# Patient Record
Sex: Male | Born: 1995
Health system: Southern US, Community
[De-identification: ages and names within clinical notes are randomized; demographics above are authoritative.]

## PROBLEM LIST (undated history)

## (undated) DIAGNOSIS — F1010 Alcohol abuse, uncomplicated: Secondary | ICD-10-CM

## (undated) DIAGNOSIS — F329 Major depressive disorder, single episode, unspecified: Secondary | ICD-10-CM

## (undated) DIAGNOSIS — F411 Generalized anxiety disorder: Secondary | ICD-10-CM

## (undated) HISTORY — DX: Major depressive disorder, single episode, unspecified: F32.9

## (undated) HISTORY — DX: Alcohol abuse, uncomplicated: F10.10

## (undated) HISTORY — DX: Generalized anxiety disorder: F41.1

---
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
## 2015-02-17 ENCOUNTER — Emergency Department (INDEPENDENT_AMBULATORY_CARE_PROVIDER_SITE_OTHER)
Admission: EM | Admit: 2015-02-17 | Discharge: 2015-02-17 | Disposition: A | Discharge status: Home / Self Care | Payer: 59 | Source: Home / Self Care | Attending: Family Medicine | Admitting: Family Medicine

## 2015-02-17 DIAGNOSIS — J029 Acute pharyngitis, unspecified: Secondary | ICD-10-CM | POA: Diagnosis not present

## 2015-02-17 DIAGNOSIS — J039 Acute tonsillitis, unspecified: Secondary | ICD-10-CM

## 2015-02-17 LAB — POCT RAPID STREP A (OFFICE): Rapid Strep A Screen: NEGATIVE

## 2015-02-17 MED ORDER — LIDOCAINE VISCOUS 2 % MT SOLN: 15.0000 mL | Freq: Three times a day (TID) | OROMUCOSAL | Status: DC

## 2015-02-17 MED ORDER — AZITHROMYCIN 250 MG PO TABS: ORAL_TABLET | ORAL | Status: DC

## 2015-02-17 MED ORDER — ACETAMINOPHEN-CODEINE 120-12 MG/5ML PO SOLN: ORAL | Status: DC

## 2015-02-17 NOTE — ED Provider Notes (Signed)
CSN: [id number]     Arrival date & time 02/17/15  1348 History   First MD Initiated Contact with Patient 02/17/15 1552     Chief Complaint  Patient presents with  . Sore Throat      HPI Comments: Patient complains of two day history of severe sore throat with fatigue, chills/sweats, and headache.  He states that a family member has had mono.  The history is provided by the patient and a parent.    History reviewed. No pertinent past medical history. History reviewed. No pertinent past surgical history. No family history on file. History  Substance Use Topics  . Smoking status: Never Smoker   . Smokeless tobacco: Not on file  . Alcohol Use: No    Review of Systems + sore throat No cough No pleuritic pain No wheezing No nasal congestion ? post-nasal drainage No sinus pain/pressure No itchy/red eyes No earache No hemoptysis No SOB No fever, + chills/sweats No nausea No vomiting No abdominal pain No diarrhea No urinary symptoms No skin rash + fatigue No myalgias + headache Used OTC meds without relief  Allergies  Review of patient's allergies indicates no known allergies.  Home Medications   Prior to Admission medications   Medication Sig Start Date End Date Taking? Authorizing Provider  acetaminophen-codeine 120-12 MG/5ML solution Take 5 to 10mL by mouth every 4 to 6 hours as needed for pain. 02/17/15   Lattie HawStephen A Aidon Klemens, MD  azithromycin (ZITHROMAX Z-PAK) 250 MG tablet Take 2 tabs today; then begin one tab once daily for 4 more days. 02/17/15   Lattie HawStephen A Ellery Meroney, MD  lidocaine (XYLOCAINE) 2 % solution Use as directed 15 mLs in the mouth or throat 4 (four) times daily -  before meals and at bedtime. Gargle, then expectorate 02/17/15   Lattie HawStephen A Aniela Caniglia, MD   BP 124/76 mmHg  Pulse 88  Temp(Src) 98.3 F (36.8 C) (Oral)  Wt 150 lb (68.04 kg)  SpO2 98% Physical Exam Nursing notes and Vital Signs reviewed. Appearance:  Patient appears stated age, and in no acute  distress Eyes:  Pupils are equal, round, and reactive to light and accomodation.  Extraocular movement is intact.  Conjunctivae are not inflamed  Ears:  Canals normal.  Tympanic membranes normal.  Nose:   Normal turbinates.  No sinus tenderness.  Pharynx:  Large erythematous tonsils with exudate.  No obstruction Neck:  Supple.  Large tender anterior nodes are palpated bilaterally  Lungs:  Clear to auscultation.  Breath sounds are equal.  Heart:  Regular rate and rhythm without murmurs, rubs, or gallops.  Abdomen:  Nontender without masses or hepatosplenomegaly.  Bowel sounds are present.  No CVA or flank tenderness.  Extremities:  No edema.  No calf tenderness Skin:  No rash present.   ED Course  Procedures  None   Labs Reviewed  STREP A DNA PROBE negative  EPSTEIN-BARR VIRUS VCA, IGG          MDM   1. Acute tonsillitis; ?false negative strep vs ?mono    Throat culture pending.  Mother declines mono antibody testing. Begin empiric Z-pack.  Lidocaine gargles. Try warm salt water gargles for sore throat.  May take Ibuprofen 200mg , 3 or 4 tabs every 8 hours with food. If symptoms become significantly worse during the night or over the weekend, proceed to the local emergency room.  Followup with Family Doctor if not improved in one week.     Lattie HawStephen A Panda Crossin, MD 02/24/15 [phone]

## 2015-02-17 NOTE — ED Notes (Signed)
Patient complains of sore throat and fever. Symptoms started yesterday.

## 2015-02-17 NOTE — Discharge Instructions (Signed)
Try warm salt water gargles for sore throat.  May take Ibuprofen 200mg , 3 or 4 tabs every 8 hours with food. If symptoms become significantly worse during the night or over the weekend, proceed to the local emergency room.

## 2015-02-18 LAB — STREP A DNA PROBE: GASP: POSITIVE

## 2015-02-19 ENCOUNTER — Telehealth: Payer: Self-pay | Admitting: *Deleted

## 2015-02-19 MED ORDER — PREDNISONE 10 MG (21) PO TBPK: ORAL_TABLET | ORAL | Status: DC

## 2015-11-20 ENCOUNTER — Emergency Department (INDEPENDENT_AMBULATORY_CARE_PROVIDER_SITE_OTHER)
Admission: EM | Admit: 2015-11-20 | Discharge: 2015-11-20 | Disposition: A | Discharge status: Home / Self Care | Payer: PRIVATE HEALTH INSURANCE | Source: Home / Self Care | Attending: Family Medicine | Admitting: Family Medicine

## 2015-11-20 ENCOUNTER — Encounter: Payer: Self-pay | Admitting: *Deleted

## 2015-11-20 DIAGNOSIS — J101 Influenza due to other identified influenza virus with other respiratory manifestations: Secondary | ICD-10-CM | POA: Diagnosis not present

## 2015-11-20 DIAGNOSIS — J9801 Acute bronchospasm: Secondary | ICD-10-CM | POA: Diagnosis not present

## 2015-11-20 LAB — POCT INFLUENZA A/B
INFLUENZA B, POC: NEGATIVE
Influenza A, POC: POSITIVE — AB

## 2015-11-20 MED ORDER — ALBUTEROL SULFATE HFA 108 (90 BASE) MCG/ACT IN AERS: 2.0000 | INHALATION_SPRAY | RESPIRATORY_TRACT | Status: DC | PRN

## 2015-11-20 MED ORDER — PREDNISONE 20 MG PO TABS: 20.0000 mg | ORAL_TABLET | Freq: Two times a day (BID) | ORAL | Status: DC

## 2015-11-20 MED ORDER — OSELTAMIVIR PHOSPHATE 75 MG PO CAPS: 75.0000 mg | ORAL_CAPSULE | Freq: Two times a day (BID) | ORAL | Status: DC

## 2015-11-20 MED FILL — OSELTAMIVIR PHOS 75 MG CAP: 75 | 5 days supply | Qty: 10 | Fill #0

## 2015-11-20 MED FILL — predniSONE 20 MG TABS: 20 | 5 days supply | Qty: 10 | Fill #0

## 2015-11-20 NOTE — ED Notes (Signed)
Pt c/o fever, aches, chills, congestion and cough x yesterday. His family member tested positive for flu A a couple of days ago. Taken 1 dose of Tamiflu, 2 puffs albuterol and generic otc cold/flu medication. Family member requests flu test performed.

## 2015-11-20 NOTE — Discharge Instructions (Signed)
Take plain guaifenesin (  extended release tabs such as Mucinex) twice daily, with plenty of water, for cough and congestion.  May add Pseudoephedrine ( , one or two every 4 to 6 hours) for sinus congestion.  Get adequate rest.   May use Afrin nasal spray (or generic oxymetazoline) twice daily for about 5 days and then discontinue.  Also recommend using saline nasal spray several times daily and saline nasal irrigation (AYR is a common brand).  Try warm salt water gargles for sore throat.  Stop all antihistamines for now, and other non-prescription cough/cold preparations. May take Delsym Cough Suppressant at bedtime for nighttime cough.  Follow-up with family doctor if not improving about one week or if fever persists. Recommend a flu shot when well.    Asthma, Adult Asthma is a recurring condition in which the airways tighten and narrow. Asthma can make it difficult to breathe. It can cause coughing, wheezing, and shortness of breath. Asthma episodes, also called asthma attacks, range from minor to life-threatening. Asthma cannot be cured, but medicines and lifestyle changes can help control it. CAUSES Asthma is believed to be caused by inherited (genetic) and environmental factors, but its exact cause is unknown. Asthma may be triggered by allergens, lung infections, or irritants in the air. Asthma triggers are different for each person. Common triggers include:   Animal dander.  Dust mites.  Cockroaches.  Pollen from trees or grass.  Mold.  Smoke.  Air pollutants such as dust, household cleaners, hair sprays, aerosol sprays, paint fumes, strong chemicals, or strong odors.  Cold air, weather changes, and winds (which increase molds and pollens in the air).  Strong emotional expressions such as crying or laughing hard.  Stress.  Certain medicines (such as aspirin) or types of drugs (such as beta-blockers).  Sulfites in foods and drinks. Foods and drinks that may contain  sulfites include dried fruit, potato chips, and sparkling grape juice.  Infections or inflammatory conditions such as the flu, a cold, or an inflammation of the nasal membranes (rhinitis).  Gastroesophageal reflux disease (GERD).  Exercise or strenuous activity. SYMPTOMS Symptoms may occur immediately after asthma is triggered or many hours later. Symptoms include:  Wheezing.  Excessive nighttime or early morning coughing.  Frequent or severe coughing with a common cold.  Chest tightness.  Shortness of breath. DIAGNOSIS  The diagnosis of asthma is made by a review of your medical history and a physical exam. Tests may also be performed. These may include:  Lung function studies. These tests show how much air you breathe in and out.  Allergy tests.  Imaging tests such as X-rays. TREATMENT  Asthma cannot be cured, but it can usually be controlled. Treatment involves identifying and avoiding your asthma triggers. It also involves medicines. There are 2 classes of medicine used for asthma treatment:   Controller medicines. These prevent asthma symptoms from occurring. They are usually taken every day.  Reliever or rescue medicines. These quickly relieve asthma symptoms. They are used as needed and provide short-term relief. Your health care provider will help you create an asthma action plan. An asthma action plan is a written plan for managing and treating your asthma attacks. It includes a list of your asthma triggers and how they may be avoided. It also includes information on when medicines should be taken and when their dosage should be changed. An action plan may also involve the use of a device called a peak flow meter. A peak flow meter measures how well the  lungs are working. It helps you monitor your condition. HOME CARE INSTRUCTIONS   Take medicines only as directed by your health care provider. Speak with your health care provider if you have questions about how or when to  take the medicines.  Use a peak flow meter as directed by your health care provider. Record and keep track of readings.  Understand and use the action plan to help minimize or stop an asthma attack without needing to seek medical care.  Control your home environment in the following ways to help prevent asthma attacks:  Do not smoke. Avoid being exposed to secondhand smoke.  Change your heating and air conditioning filter regularly.  Limit your use of fireplaces and wood stoves.  Get rid of pests (such as roaches and mice) and their droppings.  Throw away plants if you see mold on them.  Clean your floors and dust regularly. Use unscented cleaning products.  Try to have someone else vacuum for you regularly. Stay out of rooms while they are being vacuumed and for a short while afterward. If you vacuum, use a dust mask from a hardware store, a double-layered or microfilter vacuum cleaner bag, or a vacuum cleaner with a HEPA filter.  Replace carpet with wood, tile, or vinyl flooring. Carpet can trap dander and dust.  Use allergy-proof pillows, mattress covers, and box spring covers.  Wash bed sheets and blankets every week in hot water and dry them in a dryer.  Use blankets that are made of polyester or cotton.  Clean bathrooms and kitchens with bleach. If possible, have someone repaint the walls in these rooms with mold-resistant paint. Keep out of the rooms that are being cleaned and painted.  Wash hands frequently. SEEK MEDICAL CARE IF:   You have wheezing, shortness of breath, or a cough even if taking medicine to prevent attacks.  The colored mucus you cough up (sputum) is thicker than usual.  Your sputum changes from clear or white to yellow, green, gray, or bloody.  You have any problems that may be related to the medicines you are taking (such as a rash, itching, swelling, or trouble breathing).  You are using a reliever medicine more than 2-3 times per week.  Your  peak flow is still at 50-79% of your personal best after following your action plan for 1 hour.  You have a fever. SEEK IMMEDIATE MEDICAL CARE IF:   You seem to be getting worse and are unresponsive to treatment during an asthma attack.  You are short of breath even at rest.  You get short of breath when doing very little physical activity.  You have difficulty eating, drinking, or talking due to asthma symptoms.  You develop chest pain.  You develop a fast heartbeat.  You have a bluish color to your lips or fingernails.  You are light-headed, dizzy, or faint.  Your peak flow is less than 50% of your personal best.   This information is not intended to replace advice given to you by your health care provider. Make sure you discuss any questions you have with your health care provider.   Document Released: 09/28/2005 Document Revised: 06/19/2015 Document Reviewed: 04/27/2013 Elsevier Interactive Patient Education [employer]

## 2015-11-20 NOTE — ED Provider Notes (Signed)
CSN: [id number]     Arrival date & time 11/20/15  0830 History   None    Chief Complaint  Patient presents with  . Generalized Body Aches  . Fever      HPI Comments: Complains of 1 day history flu-like illness including myalgias, headache, fever/chills, fatigue, and cough.  Also has mild nasal congestion, but no sore throat.  Cough is non-productive and somewhat worse at night.  No pleuritic pain or shortness of breath.  He has not had a flu shot this season.  He has mild allergy induced asthma and has had increased wheezing today and yesterday, improved with an albuterol inhaler.  A family member tested positive for influenza A two days ago.  The history is provided by the patient and a parent.    History reviewed. No pertinent past medical history. History reviewed. No pertinent past surgical history. History reviewed. No pertinent family history. Social History  Substance Use Topics  . Smoking status: Current Every Day Smoker  . Smokeless tobacco: None  . Alcohol Use: No    Review of Systems No sore throat + cough No pleuritic pain + wheezing + nasal congestion + post-nasal drainage No sinus pain/pressure No itchy/red eyes No earache No hemoptysis No SOB + fever, + chills No nausea No vomiting No abdominal pain No diarrhea, but episode loose stools No urinary symptoms No skin rash + fatigue + myalgias + headache Used OTC meds without relief  Allergies  Review of patient's allergies indicates no known allergies.  Home Medications   Prior to Admission medications   Medication Sig Start Date End Date Taking? Authorizing Provider  albuterol (PROVENTIL HFA;VENTOLIN HFA) 108 (90 Base) MCG/ACT inhaler Inhale 2 puffs into the lungs every 4 (four) hours as needed for wheezing or shortness of breath. 11/20/15   Lattie Haw, MD  oseltamivir (TAMIFLU) 75 MG capsule Take 1 capsule (75 mg total) by mouth every 12 (twelve) hours. 11/20/15   Lattie Haw, MD  predniSONE  (DELTASONE) 20 MG tablet Take 1 tablet (20 mg total) by mouth 2 (two) times daily. Take with food. 11/20/15   Lattie Haw, MD   Meds Ordered and Administered this Visit  Medications - No data to display  BP 119/71 mmHg  Pulse 87  Temp(Src) 99.5 F (37.5 C) (Oral)  Resp 16  Wt 139 lb (63.05 kg)  SpO2 96% No data found.   Physical Exam Nursing notes and Vital Signs reviewed. Appearance:  Patient appears stated age, and in no acute distress Eyes:  Pupils are equal, round, and reactive to light and accomodation.  Extraocular movement is intact.  Conjunctivae are not inflamed  Ears:  Canals normal.  Tympanic membranes normal.  Nose:  Congested turbinates.  No sinus tenderness.  [name] Kitchen  Pharynx:  Normal Neck:  Supple.  Nontender enlarged posterior nodes are palpated bilaterally  Lungs:  Faint bibasilar wheezes present. Breath sounds are equal.  Moving air well. Heart:  Regular rate and rhythm without murmurs, rubs, or gallops.  Abdomen:  Nontender without masses or hepatosplenomegaly.  Bowel sounds are present.  No CVA or flank tenderness.  Extremities:  No edema.  Skin:  No rash present.   ED Course  Procedures nontender    Labs Reviewed  POCT INFLUENZA A/B - Abnormal; Notable for the following:    Influenza A, POC Positive (*)    All other components within normal limits     MDM   1. Influenza A   2.  Bronchospasm    Begin Tamiflu, and prednisone burst.  Rx for albuterol MDI Take plain guaifenesin (  extended release tabs such as Mucinex) twice daily, with plenty of water, for cough and congestion.  May add Pseudoephedrine ( , one or two every 4 to 6 hours) for sinus congestion.  Get adequate rest.   May use Afrin nasal spray (or generic oxymetazoline) twice daily for about 5 days and then discontinue.  Also recommend using saline nasal spray several times daily and saline nasal irrigation (AYR is a common brand).  Try warm salt water gargles for sore throat.  Stop all  antihistamines for now, and other non-prescription cough/cold preparations. May take Delsym Cough Suppressant at bedtime for nighttime cough.  Follow-up with family doctor if not improving about one week or if fever persists. Recommend a flu shot when well.     Lattie Haw, MD 11/20/15 1002

## 2017-09-13 ENCOUNTER — Ambulatory Visit (INDEPENDENT_AMBULATORY_CARE_PROVIDER_SITE_OTHER): Payer: 59 | Admitting: Family Medicine

## 2017-09-13 ENCOUNTER — Encounter: Payer: Self-pay | Admitting: Family Medicine

## 2017-09-13 VITALS — BP 124/70 | HR 64 | Ht 73.0 in | Wt 163.0 lb

## 2017-09-13 DIAGNOSIS — F411 Generalized anxiety disorder: Secondary | ICD-10-CM

## 2017-09-13 DIAGNOSIS — F321 Major depressive disorder, single episode, moderate: Secondary | ICD-10-CM | POA: Diagnosis not present

## 2017-09-13 DIAGNOSIS — F329 Major depressive disorder, single episode, unspecified: Secondary | ICD-10-CM | POA: Insufficient documentation

## 2017-09-13 DIAGNOSIS — R5383 Other fatigue: Secondary | ICD-10-CM

## 2017-09-13 HISTORY — DX: Generalized anxiety disorder: F41.1

## 2017-09-13 HISTORY — DX: Major depressive disorder, single episode, unspecified: F32.9

## 2017-09-13 MED ORDER — SERTRALINE HCL 50 MG PO TABS: ORAL_TABLET | ORAL | 3 refills | Status: DC

## 2017-09-13 NOTE — Patient Instructions (Addendum)
Thank you for coming in today. Take zoloft daily until you come back.  Take 1/2 pill daily and then take a whole pill.  Recheck in 2 weeks.  We will consider blood work then.    Generalized Anxiety Disorder, Adult Generalized anxiety disorder (GAD) is a mental health disorder. People with this condition constantly worry about everyday events. Unlike normal anxiety, worry related to GAD is not triggered by a specific event. These worries also do not fade or get better with time. GAD interferes with life functions, including relationships, work, and school. GAD can vary from mild to severe. People with severe GAD can have intense waves of anxiety with physical symptoms (panic attacks). What are the causes? The exact cause of GAD is not known. What increases the risk? This condition is more likely to develop in:  Women.  People who have a family history of anxiety disorders.  People who are very shy.  People who experience very stressful life events, such as the death of a loved one.  People who have a very stressful family environment.  What are the signs or symptoms? People with GAD often worry excessively about many things in their lives, such as their health and family. They may also be overly concerned about:  Doing well at work.  Being on time.  Natural disasters.  Friendships.  Physical symptoms of GAD include:  Fatigue.  Muscle tension or having muscle twitches.  Trembling or feeling shaky.  Being easily startled.  Feeling like your heart is pounding or racing.  Feeling out of breath or like you cannot take a deep breath.  Having trouble falling asleep or staying asleep.  Sweating.  Nausea, diarrhea, or irritable bowel syndrome (IBS).  Headaches.  Trouble concentrating or remembering facts.  Restlessness.  Irritability.  How is this diagnosed? Your health care provider can diagnose GAD based on your symptoms and medical history. You will also have  a physical exam. The health care provider will ask specific questions about your symptoms, including how severe they are, when they started, and if they come and go. Your health care provider may ask you about your use of alcohol or drugs, including prescription medicines. Your health care provider may refer you to a mental health specialist for further evaluation. Your health care provider will do a thorough examination and may perform additional tests to rule out other possible causes of your symptoms. To be diagnosed with GAD, a person must have anxiety that:  Is out of his or her control.  Affects several different aspects of his or her life, such as work and relationships.  Causes distress that makes him or her unable to take part in normal activities.  Includes at least three physical symptoms of GAD, such as restlessness, fatigue, trouble concentrating, irritability, muscle tension, or sleep problems.  Before your health care provider can confirm a diagnosis of GAD, these symptoms must be present more days than they are not, and they must last for six months or longer. How is this treated? The following therapies are usually used to treat GAD:  Medicine. Antidepressant medicine is usually prescribed for long-term daily control. Antianxiety medicines may be added in severe cases, especially when panic attacks occur.  Talk therapy (psychotherapy). Certain types of talk therapy can be helpful in treating GAD by providing support, education, and guidance. Options include: ? Cognitive behavioral therapy (CBT). People learn coping skills and techniques to ease their anxiety. They learn to identify unrealistic or negative thoughts and  behaviors and to replace them with positive ones. ? Acceptance and commitment therapy (ACT). This treatment teaches people how to be mindful as a way to cope with unwanted thoughts and feelings. ? Biofeedback. This process trains you to manage your body's response  (physiological response) through breathing techniques and relaxation methods. You will work with a therapist while machines are used to monitor your physical symptoms.  Stress management techniques. These include yoga, meditation, and exercise.  A mental health specialist can help determine which treatment is best for you. Some people see improvement with one type of therapy. However, other people require a combination of therapies. Follow these instructions at home:  Take over-the-counter and prescription medicines only as told by your health care provider.  Try to maintain a normal routine.  Try to anticipate stressful situations and allow extra time to manage them.  Practice any stress management or self-calming techniques as taught by your health care provider.  Do not punish yourself for setbacks or for not making progress.  Try to recognize your accomplishments, even if they are small.  Keep all follow-up visits as told by your health care provider. This is important. Contact a health care provider if:  Your symptoms do not get better.  Your symptoms get worse.  You have signs of depression, such as: ? A persistently sad, cranky, or irritable mood. ? Loss of enjoyment in activities that used to bring you joy. ? Change in weight or eating. ? Changes in sleeping habits. ? Avoiding friends or family members. ? Loss of energy for normal tasks. ? Feelings of guilt or worthlessness. Get help right away if:  You have serious thoughts about hurting yourself or others. If you ever feel like you may hurt yourself or others, or have thoughts about taking your own life, get help right away. You can go to your nearest emergency department or call:  Your local emergency services (911 in the [place]  A suicide crisis helpline, such as the National Suicide Prevention Lifeline at 91064571341-904 572 7720. This is open 24 hours a day.  Summary  Generalized anxiety disorder (GAD) is a mental  health disorder that involves worry that is not triggered by a specific event.  People with GAD often worry excessively about many things in their lives, such as their health and family.  GAD may cause physical symptoms such as restlessness, trouble concentrating, sleep problems, frequent sweating, nausea, diarrhea, headaches, and trembling or muscle twitching.  A mental health specialist can help determine which treatment is best for you. Some people see improvement with one type of therapy. However, other people require a combination of therapies. This information is not intended to replace advice given to you by your health care provider. Make sure you discuss any questions you have with your health care provider. Document Released: 01/23/2013 Document Revised: 08/18/2016 Document Reviewed: 08/18/2016 Elsevier Interactive Patient Education  2018 [employer]    Major Depressive Disorder, Adult Major depressive disorder (MDD) is a mental health condition. MDD often makes you feel sad, hopeless, or helpless. MDD can also cause symptoms in your body. MDD can affect your:  Work.  School.  Relationships.  Other normal activities.  MDD can range from mild to very bad. It may occur once (single episode MDD). It can also occur many times (recurrent MDD). The main symptoms of MDD often include:  Feeling sad, depressed, or irritable most of the time.  Loss of interest.  MDD symptoms also include:  Sleeping too much or too  little.  Eating too much or too little.  A change in your weight.  Feeling tired (fatigue) or having low energy.  Feeling worthless.  Feeling guilty.  Trouble making decisions.  Trouble thinking clearly.  Thoughts of suicide or harming others.  Feeling weak.  Feeling agitated.  Keeping yourself from being around other people (isolation).  Follow these instructions at home: Activity  Do these things as told by your doctor: ? Go back to your  normal activities. ? Exercise regularly. ? Spend time outdoors. Alcohol  Talk with your doctor about how alcohol can affect your antidepressant medicines.  Do not drink alcohol. Or, limit how much alcohol you drink. ? This means no more than 1 drink a day for nonpregnant women and 2 drinks a day for men. One drink equals one of these:  12 oz of beer.  5 oz of wine.  1 oz of hard liquor. General instructions  Take over-the-counter and prescription medicines only as told by your doctor.  Eat a healthy diet.  Get plenty of sleep.  Find activities that you enjoy. Make time to do them.  Think about joining a support group. Your doctor may be able to suggest a group for you.  Keep all follow-up visits as told by your doctor. This is important. Where to find more information:  The [employer] on Mental Illness: ? www.nami.org  [place] [employer] of Mental Health: ? http://www.maynard.net/  National Suicide Prevention Lifeline: ? 907 174 9420. This is free, 24-hour help. Contact a doctor if:  Your symptoms get worse.  You have new symptoms. Get help right away if:  You self-harm.  You see, hear, taste, smell, or feel things that are not present (hallucinate). If you ever feel like you may hurt yourself or others, or have thoughts about taking your own life, get help right away. You can go to your nearest emergency department or call:  Your local emergency services (911 in the [place]  A suicide crisis helpline, such as the National Suicide Prevention Lifeline: ? (818)658-7787. This is open 24 hours a day.  This information is not intended to replace advice given to you by your health care provider. Make sure you discuss any questions you have with your health care provider. Document Released: 09/09/2015 Document Revised: 06/14/2016 Document Reviewed: 06/14/2016 Elsevier Interactive Patient Education  2017 [employer].

## 2017-09-13 NOTE — Progress Notes (Signed)
Sheral FlowShawn M Kozakiewicz is a 21 y.o. male who [facility] today for anxiety.  Patient endorsing 2-3 weeks or worsening anxiety and depression. Patient states that he has struggled with underlying anxiety/depression for the past couple years, but it has been especially bad in the last 2-3 weeks. States that he feels lonely often and unhappy.  In terms of depressive symptoms, patient endorses symptoms 3-4 days per week. Feels poor mood and moves slowly. Poor sleep and feels fatigued generally. Also with poor appetite, eating approximately 1 meal per day. Feels that he is performing at work adequately, but does not feel engaged while he is there. Feels internally slow, but wouldn't necessarily expect others to notice.  He does endorse anxiety symptoms as well, occurring 2-3 times per week. Feels his mind racing when this does happen. States that he focuses on everything that is wrong when he feels anxious. Not keeping him awake at night.  Endorses occasional marijuana use. No other illicit substance use. Patient has not self-harmed. He did have passive SI 3 days ago. No active plan. Was counseled on seeking care with trusted individual, physician, ED, or 911 if he has active SI.    History reviewed. No pertinent past medical history. History reviewed. No pertinent surgical history. Social History   Tobacco Use  . Smoking status: Current Every Day Smoker  . Smokeless tobacco: Never Used  Substance Use Topics  . Alcohol use: No   family history includes Depression in his brother.  ROS as above: No headache, visual changes, nausea, vomiting, diarrhea, constipation, dizziness, abdominal pain, skin rash, fevers, chills, night sweats, weight loss, swollen lymph nodes, body aches, joint swelling, muscle aches, chest pain, shortness of breath, mood changes, visual or auditory  hallucinations.   Medications: Current Outpatient Medications  Medication Sig Dispense Refill  . albuterol (PROVENTIL HFA;VENTOLIN HFA) 108 (90 Base) MCG/ACT inhaler Inhale 2 puffs into the lungs every 4 (four) hours as needed for wheezing or shortness of breath. (Patient not taking: Reported on 09/13/2017) 1 Inhaler 0  . sertraline (ZOLOFT) 50 MG tablet Take 0.5 tablets (25 mg total) by mouth daily for 7 days, THEN 1 tablet (50 mg total) daily for 21 days. 30 tablet 3   No current facility-administered medications for this visit.    No Known Allergies  Health Maintenance Health Maintenance  Topic Date Due  . HIV Screening  09/18/2011  . TETANUS/TDAP  09/18/2015  . INFLUENZA VACCINE  05/16/2018 (Originally 05/12/2017)     Exam:  BP 124/70   Pulse 64   Ht 6\' 1"  (1.854 m)   Wt 163 lb (73.9 kg)   BMI 21.51 kg/m  Gen: Well NAD HEENT: EOMI,  MMM Lungs: Normal work of breathing. CTABL Heart: RRR no MRG Abd: NABS, Soft. Nondistended, Nontender Exts: Brisk capillary refill, warm and well perfused. Psych: Depressed mood. Affect congruent with stated mood. No psychomotor retardation, but decreased range of emotion on interview. Feels tired. No evidence of psychotic features, mania, or substance use.    Depression screen PHQ 2/9 09/13/2017  Decreased Interest 1  Down, Depressed, Hopeless 2  PHQ - 2 Score 3  Altered sleeping 2  Tired, decreased energy 3  Change in appetite 3  Feeling bad or failure about yourself  3  Trouble concentrating 2  Moving slowly or fidgety/restless 2  Suicidal thoughts 1  PHQ-9 Score 19   GAD 7 : Generalized Anxiety Score 09/13/2017  Nervous, Anxious, on Edge 2  Control/stop worrying 3  Worry too much - different things 2  Trouble relaxing 1  Restless 1  Easily annoyed or irritable 3  Afraid - awful might happen 2  Total GAD 7 Score 14  Anxiety Difficulty Very difficult       No results found for this or any previous visit (from the past 72  hour(s)). No results found.    Assessment and Plan: 21 y.o. male with major depression and anxiety symptoms. No evidence of mania, psychotic features, or substance abuse.  Patient will benefit from Zoloft 25 mg, titrated up as tolerated. He will follow-up with us in 2 weeks. Also referred to behavioral health for CBT.  Patient counseled on safety plan if having suicidal thoughts. Should seek attention from health care professional, ED, 911, or at minimum trusted friend/family. He will return to follow-up in 2 weeks.   Orders Placed This Encounter  Procedures  . Ambulatory referral to Behavioral Health    Referral Priority:   Routine    Referral Type:   Psychiatric    Referral Reason:   Specialty Services Required    Requested Specialty:   Behavioral Health    Number of Visits Requested:   1   Meds ordered this encounter  Medications  . sertraline (ZOLOFT) 50 MG tablet    Sig: Take 0.5 tablets (25 mg total) by mouth daily for 7 days, THEN 1 tablet (50 mg total) daily for 21 days.    Dispense:  30 tablet    Refill:  3     Discussed warning signs or symptoms. Please see discharge instructions. Patient expresses understanding.

## 2017-09-21 ENCOUNTER — Ambulatory Visit (INDEPENDENT_AMBULATORY_CARE_PROVIDER_SITE_OTHER): Payer: 59 | Admitting: Family Medicine

## 2017-09-21 ENCOUNTER — Ambulatory Visit (INDEPENDENT_AMBULATORY_CARE_PROVIDER_SITE_OTHER): Payer: 59

## 2017-09-21 ENCOUNTER — Encounter: Payer: Self-pay | Admitting: Family Medicine

## 2017-09-21 VITALS — BP 136/78 | HR 72

## 2017-09-21 DIAGNOSIS — M79675 Pain in left toe(s): Secondary | ICD-10-CM | POA: Diagnosis not present

## 2017-09-21 DIAGNOSIS — W2209XA Striking against other stationary object, initial encounter: Secondary | ICD-10-CM

## 2017-09-21 DIAGNOSIS — F411 Generalized anxiety disorder: Secondary | ICD-10-CM | POA: Diagnosis not present

## 2017-09-21 DIAGNOSIS — F321 Major depressive disorder, single episode, moderate: Secondary | ICD-10-CM | POA: Diagnosis not present

## 2017-09-21 DIAGNOSIS — S92535A Nondisplaced fracture of distal phalanx of left lesser toe(s), initial encounter for closed fracture: Secondary | ICD-10-CM

## 2017-09-21 DIAGNOSIS — S62667A Nondisplaced fracture of distal phalanx of left little finger, initial encounter for closed fracture: Secondary | ICD-10-CM | POA: Diagnosis not present

## 2017-09-21 NOTE — Progress Notes (Signed)
       Albert Hobbs is a 21 y.o. male who [facility] today for left toe injury.   Albert Hobbs stubbed his left 5th toe yesterday. He developed pain immediately and note that it is bruised and painful today. He denies any bleeding. No fever, chills, NVD.   Additionally he notes that he has started the Zoloft that was prescribed on the 3rd for anxiety and depression. He has increased from 25 mg to 50mg . He is tolerating it well and is feeling a bit better.    No past medical history on file. No past surgical history on file. Social History   Tobacco Use  . Smoking status: Current Every Day Smoker  . Smokeless tobacco: Never Used  Substance Use Topics  . Alcohol use: No   family history includes Depression in his brother.  ROS as above:  Medications: Current Outpatient Medications  Medication Sig Dispense Refill  . albuterol (PROVENTIL HFA;VENTOLIN HFA) 108 (90 Base) MCG/ACT inhaler Inhale 2 puffs into the lungs every 4 (four) hours as needed for wheezing or shortness of breath. 1 Inhaler 0  . sertraline (ZOLOFT) 50 MG tablet Take 0.5 tablets (25 mg total) by mouth daily for 7 days, THEN 1 tablet (50 mg total) daily for 21 days. 30 tablet 3   No current facility-administered medications for this visit.    No Known Allergies  Health Maintenance Health Maintenance  Topic Date Due  . HIV Screening  09/18/2011  . TETANUS/TDAP  09/18/2015  . INFLUENZA VACCINE  05/16/2018 (Originally 05/12/2017)     Exam:  BP 136/78   Pulse 72  Gen: Well NAD  Left foot:  Ecchymosis and tenderness at the left fifth toe.  Otherwise normal-appearing and nontender pulses capillary refill and sensation are intact. Psych: Alert and oriented. Normal affect and speech. No SI or HI.      No results found for this or any previous visit (from the past 72 hour(s)). Dg Toe 5th  Left  Result Date: 09/21/2017 CLINICAL DATA:  Pain, injury, swelling EXAM: DG TOE 5TH LEFT COMPARISON:  06/02/2008 FINDINGS: Left fifth toe soft tissue swelling noted. Nondisplaced fracture suspected of the left fifth digit distal phalanx, best demonstrated on the lateral view. No associated subluxation or dislocation. No joint abnormality. IMPRESSION: Nondisplaced left fifth digit distal phalanx fracture with soft tissue swelling. Electronically Signed   By: Judie PetitM.  Shick M.D.   On: 09/21/2017 11:03      Assessment and Plan: 21 y.o. male with left 5th toe fracture.  Plan for buddy tape and post op shoe PRN.  Recheck as scheduled on Dec 31st.  Anxiety and depression: Doing better. Continue zoloft titration and follow up on Dec 31st.    Orders Placed This Encounter  Procedures  . DG Toe 5th Left    Order Specific Question:   Reason for exam:    Answer:   left 5th toe fx?    Order Specific Question:   Preferred imaging location?    [facility]   No orders of the defined types were placed in this encounter.    Discussed warning signs or symptoms. Please see discharge instructions. Patient expresses understanding.

## 2017-09-21 NOTE — Patient Instructions (Signed)
Thank you for coming in today. Use the post op shoe as needed.  Follow up as scheduled.  I will let you know what the radiologists say about the xray.    How to Buddy Tape Buddy taping refers to taping an injured finger or toe to an uninjured finger or toe that is next to it. This protects the injured finger or toe and keeps it from moving while the injury heals. You may buddy tape a finger or toe if you have a minor sprain. Your health care provider may buddy tape your finger or toe if you have a sprain, dislocation, or fracture. You may be told to replace your buddy taping as needed. What are the risks? Generally, buddy taping is safe. However, problems may occur, such as:  Skin injury or infection.  Reduced blood flow to the finger or toe.  Skin reaction to the tape.  Do not buddy tape your toe if you have diabetes. Do not buddy tape if you know that you have an allergy to adhesives or surgical tape. How to buddy tape Before Buddy Taping Try to reduce any pain and swelling with rest, icing, and elevation:  Avoid any activity that causes pain.  Raise (elevate) your hand or foot above the level of your heart while you are sitting or lying down.  If directed, apply ice to the injured area: ? Put ice in a plastic bag. ? Place a towel between your skin and the bag. ? Leave the ice on for 20 minutes, 2-3 times per day.  Buddy Taping Procedure  Clean and dry your finger or toe as told by your health care provider.  Place a gauze pad or a piece of cloth or cotton between your injured finger or toe and the uninjured finger or toe.  Use tape to wrap around both fingers or toes so your injured finger or toe is secured to the uninjured finger or toe. ? The tape should be snug, but not tight. ? Make sure the ends of the piece of tape overlap. ? Avoid placing tape directly over the joint.  Change the tape and the padding as told by your health care provider. Remove and replace the  tape or padding if it becomes loose, worn, dirty, or wet. After Buddy Taping  Take over-the-counter and prescription medicines only as told by your health care provider.  Return to your normal activities as told by your health care provider. Ask your health care provider what activities are safe for you.  Watch the buddy-taped area and always remove buddy taping if: ? Your pain gets worse. ? Your fingers turn pale or blue. ? Your skin becomes irritated. Contact a health care provider if:  You have pain, swelling, or bruising that lasts longer than three days.  You have a fever.  Your skin is red, cracked, or irritated. Get help right away if:  The injured area becomes cold, numb, or pale.  You have severe pain, swelling, bruising, or loss of movement in your finger or toe.  Your finger or toe changes shape (deformity). This information is not intended to replace advice given to you by your health care provider. Make sure you discuss any questions you have with your health care provider. Document Released: 11/05/2004 Document Revised: 03/05/2016 Document Reviewed: 02/20/2015 Elsevier Interactive Patient Education  [employer]2018 Else[employer] Inc.

## 2017-10-11 ENCOUNTER — Ambulatory Visit: Payer: Self-pay | Admitting: Family Medicine

## 2017-10-28 ENCOUNTER — Ambulatory Visit (HOSPITAL_COMMUNITY): Payer: BLUE CROSS/BLUE SHIELD | Admitting: Licensed Clinical Social Worker

## 2017-11-28 DIAGNOSIS — S6991XA Unspecified injury of right wrist, hand and finger(s), initial encounter: Secondary | ICD-10-CM | POA: Diagnosis not present

## 2017-11-28 DIAGNOSIS — F17210 Nicotine dependence, cigarettes, uncomplicated: Secondary | ICD-10-CM | POA: Diagnosis not present

## 2017-11-28 DIAGNOSIS — F1010 Alcohol abuse, uncomplicated: Secondary | ICD-10-CM | POA: Diagnosis not present

## 2017-11-28 DIAGNOSIS — S60221A Contusion of right hand, initial encounter: Secondary | ICD-10-CM | POA: Diagnosis not present

## 2017-11-28 DIAGNOSIS — F1994 Other psychoactive substance use, unspecified with psychoactive substance-induced mood disorder: Secondary | ICD-10-CM | POA: Diagnosis not present

## 2017-11-29 DIAGNOSIS — S6991XA Unspecified injury of right wrist, hand and finger(s), initial encounter: Secondary | ICD-10-CM | POA: Diagnosis not present

## 2018-02-10 ENCOUNTER — Ambulatory Visit: Payer: Self-pay | Admitting: Physician Assistant

## 2018-04-15 ENCOUNTER — Ambulatory Visit (INDEPENDENT_AMBULATORY_CARE_PROVIDER_SITE_OTHER): Payer: No Typology Code available for payment source | Admitting: Physician Assistant

## 2018-04-15 ENCOUNTER — Encounter: Payer: Self-pay | Admitting: Physician Assistant

## 2018-04-15 VITALS — BP 126/76 | HR 81 | Ht 72.99 in | Wt 157.0 lb

## 2018-04-15 DIAGNOSIS — F321 Major depressive disorder, single episode, moderate: Secondary | ICD-10-CM | POA: Diagnosis not present

## 2018-04-15 DIAGNOSIS — R45851 Suicidal ideations: Secondary | ICD-10-CM | POA: Diagnosis not present

## 2018-04-15 DIAGNOSIS — F411 Generalized anxiety disorder: Secondary | ICD-10-CM | POA: Diagnosis not present

## 2018-04-15 DIAGNOSIS — F1010 Alcohol abuse, uncomplicated: Secondary | ICD-10-CM | POA: Diagnosis not present

## 2018-04-15 HISTORY — DX: Alcohol abuse, uncomplicated: F10.10

## 2018-04-15 MED ORDER — HYDROXYZINE PAMOATE 25 MG PO CAPS: 25.0000 mg | ORAL_CAPSULE | Freq: Three times a day (TID) | ORAL | 1 refills | Status: DC | PRN

## 2018-04-15 NOTE — Patient Instructions (Signed)
Suicidal Feelings: How to Help Yourself  Suicide is the taking of one's own life. If you feel as though life is getting too tough to handle and are thinking about suicide, get help right away. To get help:  Call your local emergency services (911 in the [place]  Call a suicide hotline to speak with a trained counselor who understands how you are feeling. The following is a list of suicide hotlines in the [place]. For a list of hotlines in [place], visit www.suicide.org/hotlines/international/[place]-suicide-hotlines.html.  1-800-273-TALK (1-800-273-8255).  1-800-SUICIDE (1-800-784-2433).  1-888-628-9454. This is a hotline for Spanish speakers.  1-800-799-4TTY (1-800-799-4889). This is a hotline for TTY users.  1-866-4-U-TREVOR (1-866-488-7386). This is a hotline for lesbian, gay, bisexual, transgender, or questioning youth.  Contact a crisis center or a local suicide prevention center. To find a crisis center or suicide prevention center:  Call your local hospital, clinic, community service organization, mental health center, social service provider, or health department. Ask for assistance in connecting to a crisis center.  Visit www.suicidepreventionlifeline.org/getinvolved/locator for a list of crisis centers in the [place], or visit www.suicideprevention.ca/thinking-about-suicide/find-a-crisis-centre for a list of centers in [place].  Visit the following websites:  National Suicide Prevention Lifeline: www.suicidepreventionlifeline.org  Hopeline: www.hopeline.com  American Foundation for Suicide Prevention: www.afsp.org  The Trevor Project (for lesbian, gay, bisexual, transgender, or questioning youth): www.thetrevorproject.org    How can I help myself feel better?  Promise yourself that you will not do anything drastic when you have suicidal feelings. Remember, there is hope. Many people have gotten through suicidal thoughts and feelings, and you will, too. You may have gotten through them before, and  this proves that you can get through them again.  Let family, friends, teachers, or counselors know how you are feeling. Try not to isolate yourself from those who care about you. Remember, they will want to help you. Talk with someone every day, even if you do not feel sociable. Face-to-face conversation is best.  Call a mental health professional and see one regularly.  Visit your primary health care provider every year.  Eat a well-balanced diet, and space your meals so you eat regularly.  Get plenty of rest.  Avoid alcohol and drugs, and remove them from your home. They will only make you feel worse.  If you are thinking of taking a lot of medicine, give your medicine to someone who can give it to you one day at a time. If you are on antidepressants and are concerned you will overdose, let your health care provider know so he or she can give you safer medicines. Ask your mental health professional about the possible side effects of any medicines you are taking.  Remove weapons, poisons, knives, and anything else that could harm you from your home.  Try to stick to routines. Follow a schedule every day. Put self-care on your schedule.  Make a list of realistic goals, and cross them off when you achieve them. Accomplishments give a sense of worth.  Wait until you are feeling better before doing the things you find difficult or unpleasant.  Exercise if you are able. You will feel better if you exercise for even a half hour each day.  Go out in the sun or into nature. This will help you recover from depression faster. If you have a favorite place to walk, go there.  Do the things that have always given you pleasure. Play your favorite music, read a good book, paint a picture, play your favorite   instrument, or do anything else that takes your mind off your depression if it is safe to do.  Keep your living space well lit.  When you are feeling well, write yourself a letter about tips and support that you can read when  you are not feeling well.  Remember that life's difficulties can be sorted out with help. Conditions can be treated. You can work on thoughts and strategies that serve you well.  This information is not intended to replace advice given to you by your health care provider. Make sure you discuss any questions you have with your health care provider.  Document Released: 04/04/2003 Document Revised: 05/27/2016 Document Reviewed: 01/23/2014  Elsevier Interactive Patient Education  2018 [employer] Inc[employer]

## 2018-04-15 NOTE — Progress Notes (Signed)
Subjective:    Patient ID: Albert Hobbs, male    DOB: 1996-07-21, 22 y.o.   MRN: [id number]  HPI  Pt is a 22 yo male with a long hx of anxiety and depression with alcohol abuse who presents to the clinic with suidical thoughts. Pt has had anxiety and depression since a very young age 52-22 years old. He has never been treated with medications. He tried zoloft once but did not like the way he felt on it. He is drinking every day at least 10 beers. Denies any self harm. Denies any illegal drug use. No marijuanna use. No problems sleeping. Has had suicidal thoughts off and on for many years. Recently feels like they are worsening. He states "he just feels like the world would be better if he were dead". He told his mother yesterday after girlfriend kicked him out of their apartment. Today he denies any plan but he does state the plan of "hanging himself". His mother has him a plane ticket tomorrow for a rehab in [place].  .. Active Ambulatory Problems    Diagnosis Date Noted  . MDD (major depressive disorder) 09/13/2017  . GAD (generalized anxiety disorder) 09/13/2017  . Alcohol abuse 04/15/2018  . Suicidal thoughts 04/15/2018   Resolved Ambulatory Problems    Diagnosis Date Noted  . No Resolved Ambulatory Problems   No Additional Past Medical History    Review of Systems  All other systems reviewed and are negative.      Objective:   Physical Exam  Constitutional: He is oriented to person, place, and time. He appears well-developed and well-nourished.  HENT:  Head: Normocephalic and atraumatic.  Cardiovascular: Normal rate and regular rhythm.  Pulmonary/Chest: Effort normal and breath sounds normal.  Neurological: He is alert and oriented to person, place, and time.  Psychiatric:  Flat affect/avoided eye contact          Assessment & Plan:  [name] KitchenMarland KitchenEsai was seen today for depression.  Diagnoses and all orders for this visit:  Current moderate episode of major depressive  disorder without prior episode (HCC)  GAD (generalized anxiety disorder) -     hydrOXYzine (VISTARIL) 25 MG capsule; Take 1 capsule (25 mg total) by mouth 3 (three) times daily as needed.  Alcohol abuse -     hydrOXYzine (VISTARIL) 25 MG capsule; Take 1 capsule (25 mg total) by mouth 3 (three) times daily as needed.  Suicidal thoughts   .[name] Kitchen Depression screen [facility] 2/9 04/15/2018 09/13/2017  Decreased Interest 2 1  Down, Depressed, Hopeless 3 2  PHQ - 2 Score 5 3  Altered sleeping 2 2  Tired, decreased energy 2 3  Change in appetite 3 3  Feeling bad or failure about yourself  3 3  Trouble concentrating 1 2  Moving slowly or fidgety/restless 1 2  Suicidal thoughts 2 1  PHQ-9 Score 19 19  Difficult doing work/chores Very difficult -   .. GAD 7 : Generalized Anxiety Score 04/15/2018 09/13/2017  Nervous, Anxious, on Edge 2 2  Control/stop worrying 3 3  Worry too much - different things 2 2  Trouble relaxing 2 1  Restless 2 1  Easily annoyed or irritable 3 3  Afraid - awful might happen 2 2  Total GAD 7 Score 16 14  Anxiety Difficulty Very difficult Very difficult   Pt did not want to start daily medication. I am cautious to start a dependent medication such as benzodiazapine's due to abuse potential. Lets start vistaril as  needed. Pt is going this weekend to [place] [facility] for alcohol detox. Discussed when he gets back counseling referral. Potentially he needs to consider trying other medications. Gave him HO of resources and places to go if were to continue to have suicidal thoughts. He states he is not actively going to hurt himself. Follow up after return from [place] with PCP.     [name] Kitchen..Spent 30 minutes with patient and greater than 50 percent of visit spent counseling patient regarding treatment plan.

## 2018-05-14 IMAGING — DX DG TOE 5TH 2+V*L*
3 series · 3 of 3 positions shown · non-contrast
Comparison: 06/02/2008

CLINICAL DATA: Pain, injury, swelling

EXAM:
DG TOE 5TH LEFT

[toe ap]
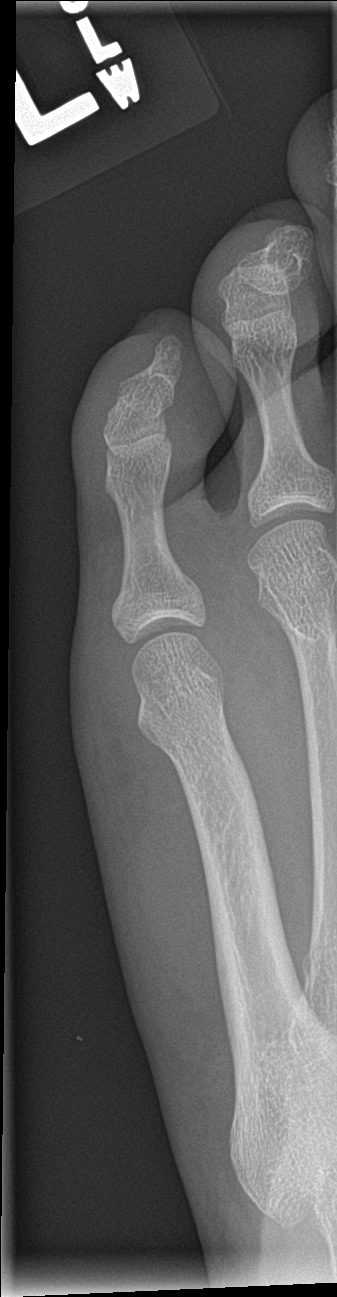

[toe obl]
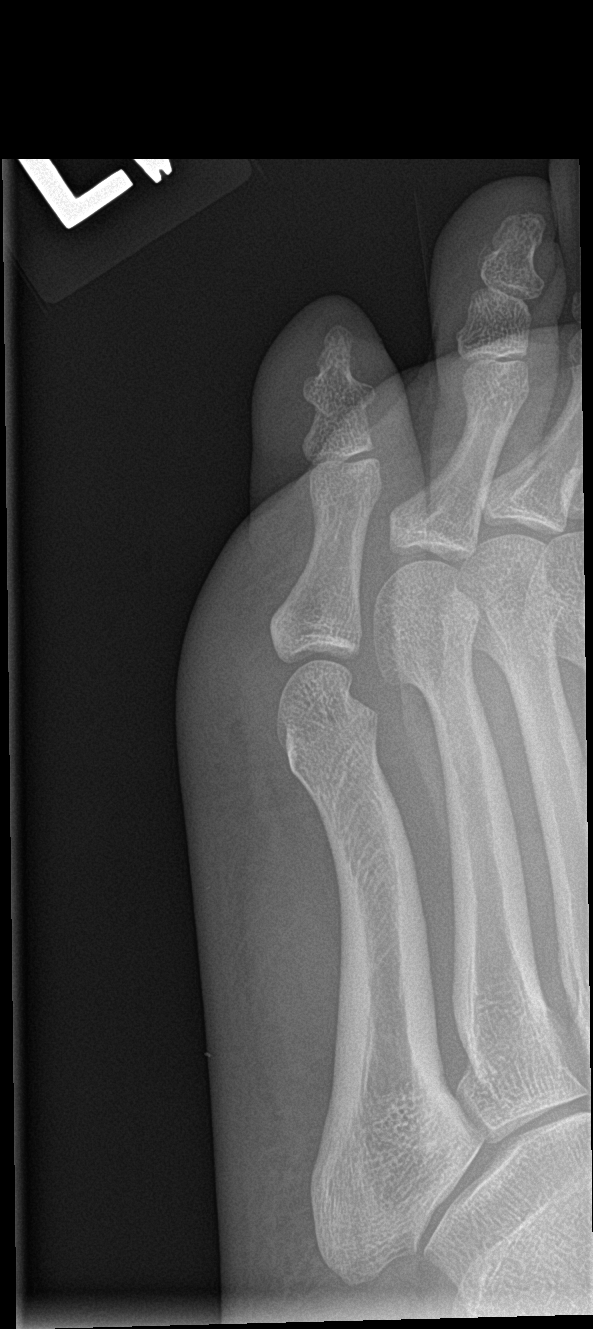

[toe lat]
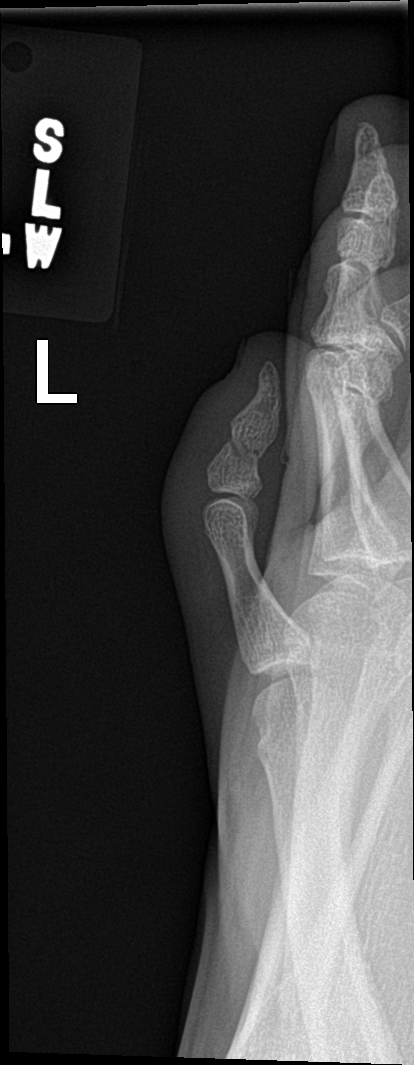

[3 of 3 positions shown; findings below may reference images not displayed]

FINDINGS: Left fifth toe soft tissue swelling noted. Nondisplaced fracture
suspected of the left fifth digit distal phalanx, best demonstrated
on the lateral view. No associated subluxation or dislocation. No
joint abnormality.
IMPRESSION: Nondisplaced left fifth digit distal phalanx fracture with soft
tissue swelling.

## 2018-06-09 ENCOUNTER — Telehealth: Payer: Self-pay | Admitting: Family Medicine

## 2018-06-09 NOTE — Telephone Encounter (Signed)
Called patient twice but got a recording that patient has a vm that has not been setup. Albert Hobbs,CMA

## 2018-06-09 NOTE — Telephone Encounter (Signed)
Recommend follow-up with me to discuss his depression.

## 2019-02-17 ENCOUNTER — Encounter: Payer: Self-pay | Admitting: Family Medicine

## 2019-02-17 ENCOUNTER — Ambulatory Visit (INDEPENDENT_AMBULATORY_CARE_PROVIDER_SITE_OTHER): Payer: BLUE CROSS/BLUE SHIELD | Admitting: Family Medicine

## 2019-02-17 VITALS — Ht 73.0 in | Wt 180.0 lb

## 2019-02-17 DIAGNOSIS — F321 Major depressive disorder, single episode, moderate: Secondary | ICD-10-CM

## 2019-02-17 DIAGNOSIS — F411 Generalized anxiety disorder: Secondary | ICD-10-CM

## 2019-02-17 DIAGNOSIS — F1010 Alcohol abuse, uncomplicated: Secondary | ICD-10-CM | POA: Diagnosis not present

## 2019-02-17 MED ORDER — BUSPIRONE HCL 10 MG PO TABS: 10.0000 mg | ORAL_TABLET | Freq: Three times a day (TID) | ORAL | 1 refills | Status: DC

## 2019-02-17 MED ORDER — SERTRALINE HCL 25 MG PO TABS: ORAL_TABLET | ORAL | 1 refills | Status: DC

## 2019-02-17 NOTE — Progress Notes (Signed)
Virtual Visit  I connected with      Vic Flowers Abbasi  by a telemedicine application and verified that I am speaking with the correct person using two identifiers.   I discussed the limitations of evaluation and management by telemedicine and the availability of in person appointments. The patient expressed understanding and agreed to proceed.  History of Present Illness: Albert Hobbs is a 23 y.o. male who would like to discuss anxiety depression and alcohol use.  Wister notes a long history of alcohol abuse.  He has had rehab in the past.  He previously was drinking about 4-5 drinks per day and quit 2 days ago.  He denies any history of severe alcohol withdrawal.  He denies any tremor.  He is not attending AA but is hopeful and optimistic.  Additionally he notes history of anxiety and depression.  He notes both anxiety and depression have been a lot worse recently.  He feels worthless at times and is not happy with how his life is going.  He does have some support with his parents as well as a girlfriend.  He notes he is having panic attacks as well.  In the past he had hydroxyzine for this which he thought did not help very much.   He notes a family history of mental health problems.  He is not sure what any but his diagnosis was or what medications they have tried.  He is does not think anybody is ever been diagnosed with bipolar disorder.  Observations/Objective: Ht 6\' 1"  (1.854 m)   Wt 180 lb (81.6 kg)   BMI 23.75 kg/m  Wt Readings from Last 5 Encounters:  02/17/19 180 lb (81.6 kg)  04/15/18 157 lb (71.2 kg)  09/13/17 163 lb (73.9 kg)  11/20/15 139 lb (63 kg) (26 %, Z= -0.63)*  02/17/15 150 lb (68 kg) (50 %, Z= 0.00)*   * Growth percentiles are based on CDC (Boys, 2-20 Years) data.   Exam: Normal Speech.  Psych: Alert and oriented normal speech and thought process.  Normal verbal affect.  No active SI or HI expressed..  Depression screen [facility] 2/9 02/17/2019 04/15/2018  09/13/2017  Decreased Interest 1 2 1   Down, Depressed, Hopeless 2 3 2   PHQ - 2 Score 3 5 3   Altered sleeping 1 2 2   Tired, decreased energy 2 2 3   Change in appetite 1 3 3   Feeling bad or failure about yourself  3 3 3   Trouble concentrating 0 1 2  Moving slowly or fidgety/restless 1 1 2   Suicidal thoughts 0 2 1  PHQ-9 Score 11 19 19   Difficult doing work/chores Very difficult Very difficult -   GAD 7 : Generalized Anxiety Score 02/17/2019 04/15/2018 09/13/2017  Nervous, Anxious, on Edge 3 2 2   Control/stop worrying 3 3 3   Worry too much - different things 3 2 2   Trouble relaxing 2 2 1   Restless 2 2 1   Easily annoyed or irritable 3 3 3   Afraid - awful might happen 0 2 2  Total GAD 7 Score 16 16 14   Anxiety Difficulty Very difficult Very difficult Very difficult      Assessment and Plan: 23 y.o. male with  Depression: Moderate.  Complicated by alcohol abuse.  Discussed options.  Plan for trial of Zoloft.  Recheck in 1 week.  Suicide precautions reviewed.  Panic attacks/anxiety: Not well controlled at all.  Again complicated by alcohol abuse history.  Trial of Zoloft as above  and he will use BuSpar for control of anxiety in the short-term.  Recheck in 1 week.  Alcohol abuse: Abstinent after 2 days.  Patient low to moderate risk for alcohol withdrawal based on drinking history.  Discussed options.  Watchful waiting.  Consider AA or other group help.  PDMP not reviewed this encounter. No orders of the defined types were placed in this encounter.  Meds ordered this encounter  Medications  . busPIRone (BUSPAR) 10 MG tablet    Sig: Take 1 tablet (10 mg total) by mouth 3 (three) times daily.    Dispense:  30 tablet    Refill:  1  . sertraline (ZOLOFT) 25 MG tablet    Sig: Take 1 tablet (25 mg total) by mouth daily for 7 days, THEN 2 tablets (50 mg total) daily for 14 days.    Dispense:  30 tablet    Refill:  1    Follow Up Instructions:    I discussed the assessment and  treatment plan with the patient. The patient was provided an opportunity to ask questions and all were answered. The patient agreed with the plan and demonstrated an understanding of the instructions.   The patient was advised to call back or seek an in-person evaluation if the symptoms worsen or if the condition fails to improve as anticipated.  Time: 25 minutes of intraservice time, with >39 minutes of total time during today's visit.      Historical information moved to improve visibility of documentation.  Past Medical History:  Diagnosis Date  . Alcohol abuse 04/15/2018  . GAD (generalized anxiety disorder) 09/13/2017  . MDD (major depressive disorder) 09/13/2017   History reviewed. No pertinent surgical history. Social History   Tobacco Use  . Smoking status: Current Every Day Smoker  . Smokeless tobacco: Never Used  Substance Use Topics  . Alcohol use: No   family history includes Depression in his brother.  Medications: Current Outpatient Medications  Medication Sig Dispense Refill  . albuterol (PROVENTIL HFA;VENTOLIN HFA) 108 (90 Base) MCG/ACT inhaler Inhale 2 puffs into the lungs every 4 (four) hours as needed for wheezing or shortness of breath. 1 Inhaler 0  . busPIRone (BUSPAR) 10 MG tablet Take 1 tablet (10 mg total) by mouth 3 (three) times daily. 30 tablet 1  . sertraline (ZOLOFT) 25 MG tablet Take 1 tablet (25 mg total) by mouth daily for 7 days, THEN 2 tablets (50 mg total) daily for 14 days. 30 tablet 1   No current facility-administered medications for this visit.    Allergies  Allergen Reactions  . Hydroxyzine     "makes me feel weird"

## 2019-02-17 NOTE — Progress Notes (Signed)
Unable to check vitals. Stopped hydroxyzine "made him feel weird", stopped months ago.  PHQ/GAD completed. Reports anxiety attacks most days.

## 2019-02-17 NOTE — Patient Instructions (Signed)
Thank you for coming in today. Start zoloft 1 pill daily.  Recheck next week.  Take burspar 3x daily as needed.  Be ready for a video or phone visit Thursday 4pm eastern time.

## 2019-02-23 ENCOUNTER — Ambulatory Visit (INDEPENDENT_AMBULATORY_CARE_PROVIDER_SITE_OTHER): Payer: BLUE CROSS/BLUE SHIELD | Admitting: Family Medicine

## 2019-02-23 DIAGNOSIS — Z5329 Procedure and treatment not carried out because of patient's decision for other reasons: Secondary | ICD-10-CM

## 2019-02-23 DIAGNOSIS — Z91199 Patient's noncompliance with other medical treatment and regimen due to unspecified reason: Secondary | ICD-10-CM

## 2019-02-23 NOTE — Progress Notes (Signed)
Patient canceled the planned visit today at the last minute and will plan on rescheduling in near future.

## 2019-03-08 ENCOUNTER — Telehealth (INDEPENDENT_AMBULATORY_CARE_PROVIDER_SITE_OTHER): Payer: BLUE CROSS/BLUE SHIELD | Admitting: Family Medicine

## 2019-03-08 ENCOUNTER — Encounter: Payer: Self-pay | Admitting: Family Medicine

## 2019-03-08 DIAGNOSIS — F1010 Alcohol abuse, uncomplicated: Secondary | ICD-10-CM | POA: Diagnosis not present

## 2019-03-08 DIAGNOSIS — F411 Generalized anxiety disorder: Secondary | ICD-10-CM

## 2019-03-08 DIAGNOSIS — F321 Major depressive disorder, single episode, moderate: Secondary | ICD-10-CM

## 2019-03-08 MED ORDER — SERTRALINE HCL 100 MG PO TABS: 100.0000 mg | ORAL_TABLET | Freq: Every day | ORAL | 1 refills | Status: DC

## 2019-03-08 MED ORDER — BUSPIRONE HCL 10 MG PO TABS: 10.0000 mg | ORAL_TABLET | Freq: Three times a day (TID) | ORAL | 1 refills | Status: DC

## 2019-03-08 NOTE — Progress Notes (Signed)
Virtual Visit  via Video Note  I connected with      Albert Hobbs by a video enabled telemedicine application and verified that I am speaking with the correct person using two identifiers.   I discussed the limitations of evaluation and management by telemedicine and the availability of in person appointments. The patient expressed understanding and agreed to proceed.  History of Present Illness: Albert Hobbs is a 23 y.o. male who would like to discuss mood.   Patient was last seen a few weeks ago for mood.  At that time he had depression and anxiety complicated by alcohol use.  He was started on Zoloft and buspirone.  He currently is taking 25 mg of Zoloft daily and 10 mg of buspirone up to 3 times a day.  He notes this is helped quite a bit and is feeling a lot better.  He notes that he is also been able to remain abstinent from alcohol.  He notes he is having a little bit of trouble sleeping but is very happy with how things are going.  Observations/Objective:  Appearance Normal Speech.  Psych: Alert and oriented normal speech verbal thought process and affect.  No SI or HI expressed.  [facility] 03/08/2019 02/23/2019 02/17/2019 04/15/2018 09/13/2017  Decreased Interest 1 1 1 2 1   Down, Depressed, Hopeless 1 1 2 3 2   PHQ - 2 Score 2 2 3 5 3   Altered sleeping 2 2 1 2 2   Tired, decreased energy 1 0 2 2 3   Change in appetite 2 1 1 3 3   Feeling bad or failure about yourself  1 1 3 3 3   Trouble concentrating 0 0 0 1 2  Moving slowly or fidgety/restless 0 0 1 1 2   Suicidal thoughts 0 0 0 2 1  PHQ-9 Score 8 6 11 19 19   Difficult doing work/chores Somewhat difficult Not difficult at all Very difficult Very difficult -    GAD 7 : Generalized Anxiety Score 03/08/2019 02/23/2019 02/17/2019 04/15/2018  Nervous, Anxious, on Edge 1 0 3 2  Control/stop worrying 1 0 3 3  Worry too much - different things 1 1 3 2   Trouble relaxing 2 0 2 2  Restless 2 0 2 2  Easily annoyed or  irritable 2 1 3 3   Afraid - awful might happen 0 0 0 2  Total GAD 7 Score 9 2 16 16   Anxiety Difficulty Somewhat difficult Not difficult at all Very difficult Very difficult     Lab and Radiology Results No results found for this or any previous visit (from the past 72 hour(s)). No results found.   Assessment and Plan: 23 y.o. male with depression and anxiety.  Much better controlled now.  Plan to continue Zoloft titration.  Increase to 100 mg.  Will use 50 mg dose for 1 week then increase to 100.  Continue buspirone as needed.  We discussed trazodone for insomnia but patient would like to hold off for now.  Recheck in 1 month.  Remain abstinent from alcohol.  PDMP not reviewed this encounter. No orders of the defined types were placed in this encounter.  Meds ordered this encounter  Medications  . sertraline (ZOLOFT) 100 MG tablet    Sig: Take 1 tablet (100 mg total) by mouth daily.    Dispense:  30 tablet    Refill:  1  . busPIRone (BUSPAR) 10 MG tablet    Sig: Take 1 tablet (  10 mg total) by mouth 3 (three) times daily.    Dispense:  90 tablet    Refill:  1    Follow Up Instructions:    I discussed the assessment and treatment plan with the patient. The patient was provided an opportunity to ask questions and all were answered. The patient agreed with the plan and demonstrated an understanding of the instructions.   The patient was advised to call back or seek an in-person evaluation if the symptoms worsen or if the condition fails to improve as anticipated.  Time: 15 minutes of intraservice time, with >22 minutes of total time during today's visit.      Historical information moved to improve visibility of documentation.  Past Medical History:  Diagnosis Date  . Alcohol abuse 04/15/2018  . GAD (generalized anxiety disorder) 09/13/2017  . MDD (major depressive disorder) 09/13/2017   No past surgical history on file. Social History   Tobacco Use  . Smoking status:  Current Every Day Smoker  . Smokeless tobacco: Never Used  Substance Use Topics  . Alcohol use: No   family history includes Depression in his brother.  Medications: Current Outpatient Medications  Medication Sig Dispense Refill  . albuterol (PROVENTIL HFA;VENTOLIN HFA) 108 (90 Base) MCG/ACT inhaler Inhale 2 puffs into the lungs every 4 (four) hours as needed for wheezing or shortness of breath. 1 Inhaler 0  . busPIRone (BUSPAR) 10 MG tablet Take 1 tablet (10 mg total) by mouth 3 (three) times daily. 90 tablet 1  . sertraline (ZOLOFT) 100 MG tablet Take 1 tablet (100 mg total) by mouth daily. 30 tablet 1   No current facility-administered medications for this visit.    Allergies  Allergen Reactions  . Hydroxyzine     "makes me feel weird"

## 2019-03-08 NOTE — Patient Instructions (Signed)
Thank you for coming in today. Increase zoloft to 100mg .  Take 1/2 pill daily for 1 week then increase to 1 pill daily.  For buspar ok to take 3x daily.   For insomnia sometime we use trazodone for sleep if needed.    Trazodone tablets What is this medicine? TRAZODONE (TRAZ oh done) is used to treat depression. This medicine may be used for other purposes; ask your health care provider or pharmacist if you have questions. COMMON BRAND NAME(S): Desyrel What should I tell my health care provider before I take this medicine? They need to know if you have any of these conditions: -attempted suicide or thinking about it -bipolar disorder -bleeding problems -glaucoma -heart disease, or previous heart attack -irregular heart beat -kidney or liver disease -low levels of sodium in the blood -an unusual or allergic reaction to trazodone, other medicines, foods, dyes or preservatives -pregnant or trying to get pregnant -breast-feeding How should I use this medicine? Take this medicine by mouth with a glass of water. Follow the directions on the prescription label. Take this medicine shortly after a meal or a light snack. Take your medicine at regular intervals. Do not take your medicine more often than directed. Do not stop taking this medicine suddenly except upon the advice of your doctor. Stopping this medicine too quickly may cause serious side effects or your condition may worsen. A special MedGuide will be given to you by the pharmacist with each prescription and refill. Be sure to read this information carefully each time. Talk to your pediatrician regarding the use of this medicine in children. Special care may be needed. Overdosage: If you think you have taken too much of this medicine contact a poison control center or emergency room at once. NOTE: This medicine is only for you. Do not share this medicine with others. What if I miss a dose? If you miss a dose, take it as soon as you  can. If it is almost time for your next dose, take only that dose. Do not take double or extra doses. What may interact with this medicine? Do not take this medicine with any of the following medications: -certain medicines for fungal infections like fluconazole, itraconazole, ketoconazole, posaconazole, voriconazole -cisapride -dofetilide -dronedarone -linezolid -MAOIs like Carbex, Eldepryl, Marplan, Nardil, and Parnate -mesoridazine -methylene blue (injected into a vein) -pimozide -saquinavir -thioridazine This medicine may also interact with the following medications: -alcohol -antiviral medicines for HIV or AIDS -aspirin and aspirin-like medicines -barbiturates like phenobarbital -certain medicines for blood pressure, heart disease, irregular heart beat -certain medicines for depression, anxiety, or psychotic disturbances -certain medicines for migraine headache like almotriptan, eletriptan, frovatriptan, naratriptan, rizatriptan, sumatriptan, zolmitriptan -certain medicines for seizures like carbamazepine and phenytoin -certain medicines for sleep -certain medicines that treat or prevent blood clots like dalteparin, enoxaparin, warfarin -digoxin -fentanyl -lithium -NSAIDS, medicines for pain and inflammation, like ibuprofen or naproxen -other medicines that prolong the QT interval (cause an abnormal heart rhythm) -rasagiline -supplements like St. John's wort, kava kava, valerian -tramadol -tryptophan This list may not describe all possible interactions. Give your health care provider a list of all the medicines, herbs, non-prescription drugs, or dietary supplements you use. Also tell them if you smoke, drink alcohol, or use illegal drugs. Some items may interact with your medicine. What should I watch for while using this medicine? Tell your doctor if your symptoms do not get better or if they get worse. Visit your doctor or health care professional for regular checks on  your  progress. Because it may take several weeks to see the full effects of this medicine, it is important to continue your treatment as prescribed by your doctor. Patients and their families should watch out for new or worsening thoughts of suicide or depression. Also watch out for sudden changes in feelings such as feeling anxious, agitated, panicky, irritable, hostile, aggressive, impulsive, severely restless, overly excited and hyperactive, or not being able to sleep. If this happens, especially at the beginning of treatment or after a change in dose, call your health care professional. Bonita Quin may get drowsy or dizzy. Do not drive, use machinery, or do anything that needs mental alertness until you know how this medicine affects you. Do not stand or sit up quickly, especially if you are an older patient. This reduces the risk of dizzy or fainting spells. Alcohol may interfere with the effect of this medicine. Avoid alcoholic drinks. This medicine may cause dry eyes and blurred vision. If you wear contact lenses you may feel some discomfort. Lubricating drops may help. See your eye doctor if the problem does not go away or is severe. Your mouth may get dry. Chewing sugarless gum, sucking hard candy and drinking plenty of water may help. Contact your doctor if the problem does not go away or is severe. What side effects may I notice from receiving this medicine? Side effects that you should report to your doctor or health care professional as soon as possible: -allergic reactions like skin rash, itching or hives, swelling of the face, lips, or tongue -elevated mood, decreased need for sleep, racing thoughts, impulsive behavior -confusion -fast, irregular heartbeat -feeling faint or lightheaded, falls -feeling agitated, angry, or irritable -loss of balance or coordination -painful or prolonged erections -restlessness, pacing, inability to keep still -suicidal thoughts or other mood  changes -tremors -trouble sleeping -seizures -unusual bleeding or bruising Side effects that usually do not require medical attention (report to your doctor or health care professional if they continue or are bothersome): -change in sex drive or performance -change in appetite or weight -constipation -headache -muscle aches or pains -nausea This list may not describe all possible side effects. Call your doctor for medical advice about side effects. You may report side effects to FDA at 1-800-FDA-1088. Where should I keep my medicine? Keep out of the reach of children. Store at room temperature between 15 and 30 degrees C (59 to 86 degrees F). Protect from light. Keep container tightly closed. Throw away any unused medicine after the expiration date. NOTE: This sheet is a summary. It may not cover all possible information. If you have questions about this medicine, talk to your doctor, pharmacist, or health care provider.  2019 Elsevier/Gold Standard (2017-12-07 17:51:24)

## 2019-03-27 ENCOUNTER — Encounter: Payer: Self-pay | Admitting: Family Medicine

## 2019-03-28 MED ORDER — BUSPIRONE HCL 10 MG PO TABS: 10.0000 mg | ORAL_TABLET | Freq: Three times a day (TID) | ORAL | 1 refills | Status: DC

## 2019-03-29 ENCOUNTER — Other Ambulatory Visit: Payer: Self-pay

## 2019-03-29 MED ORDER — SERTRALINE HCL 100 MG PO TABS: 100.0000 mg | ORAL_TABLET | Freq: Every day | ORAL | 0 refills | Status: DC

## 2019-04-11 ENCOUNTER — Telehealth (INDEPENDENT_AMBULATORY_CARE_PROVIDER_SITE_OTHER): Payer: BC Managed Care – PPO | Admitting: Family Medicine

## 2019-04-11 DIAGNOSIS — F321 Major depressive disorder, single episode, moderate: Secondary | ICD-10-CM

## 2019-04-11 DIAGNOSIS — K521 Toxic gastroenteritis and colitis: Secondary | ICD-10-CM | POA: Diagnosis not present

## 2019-04-11 DIAGNOSIS — F411 Generalized anxiety disorder: Secondary | ICD-10-CM | POA: Diagnosis not present

## 2019-04-11 MED ORDER — CITALOPRAM HYDROBROMIDE 20 MG PO TABS: 20.0000 mg | ORAL_TABLET | Freq: Every day | ORAL | 3 refills | Status: DC

## 2019-04-11 MED ORDER — BUSPIRONE HCL 10 MG PO TABS: 10.0000 mg | ORAL_TABLET | Freq: Three times a day (TID) | ORAL | 1 refills | Status: DC

## 2019-04-11 NOTE — Progress Notes (Signed)
Virtual Visit  via Video Note  I connected with      Ashok CroonShawn M Eckhart by a telemedicine application and verified that I am speaking with the correct person using two identifiers.   I discussed the limitations of evaluation and management by telemedicine and the availability of in person appointments. The patient expressed understanding and agreed to proceed.  History of Present Illness: Albert Hobbs is a 23 y.o. male who would like to discuss feelings of being stressed all the time due to a recent move and trouble with work. Patient also complains of ongoing diarrhea for the last two weeks that patient suspects is either due to stress or a side effect of the Zoloft.  He notes depression symptoms have improved a bit with Zoloft but he finds the side effects to be obnoxious.  He thinks in the past he may have tried Prozac and he thinks it may have caused him to feel like a zombie.  He thinks the diarrhea started with onset of Zoloft.    Observations/Objective: There were no vitals taken for this visit. Wt Readings from Last 5 Encounters:  02/17/19 180 lb (81.6 kg)  04/15/18 157 lb (71.2 kg)  09/13/17 163 lb (73.9 kg)  11/20/15 139 lb (63 kg) (26 %, Z= -0.63)*  02/17/15 150 lb (68 kg) (50 %, Z= 0.00)*   * Growth percentiles are based on CDC (Boys, 2-20 Years) data.   Exam: Psych: Alert and Oriented x4. Normal speech and affect. [facility] 04/11/2019 04/11/2019 03/08/2019 02/23/2019 02/17/2019  Decreased Interest 1 1 1 1 1   Down, Depressed, Hopeless 0 1 1 1 2   PHQ - 2 Score 1 2 2 2 3   Altered sleeping 1 1 2 2 1   Tired, decreased energy 1 0 1 0 2  Change in appetite 3 3 2 1 1   Feeling bad or failure about yourself  0 0 1 1 3   Trouble concentrating 1 0 0 0 0  Moving slowly or fidgety/restless 1 2 0 0 1  Suicidal thoughts 0 0 0 0 0  PHQ-9 Score 8 8 8 6 11   Difficult doing work/chores Somewhat difficult - Somewhat difficult Not difficult at all Very difficult   GAD 7 :  Generalized Anxiety Score 04/11/2019 03/08/2019 02/23/2019 02/17/2019  Nervous, Anxious, on Edge 1 1 0 3  Control/stop worrying 1 1 0 3  Worry too much - different things 1 1 1 3   Trouble relaxing 1 2 0 2  Restless 1 2 0 2  Easily annoyed or irritable 1 2 1 3   Afraid - awful might happen 0 0 0 0  Total GAD 7 Score 6 9 2 16   Anxiety Difficulty Somewhat difficult Somewhat difficult Not difficult at all Very difficult     Lab and Radiology Results No results found for this or any previous visit (from the past 72 hour(s)). No results found.   Assessment and Plan: 23 y.o. male with Hx of MDD, GAD, and alcohol abuse is complaining of feelings of being stressed all the time. Also complains of ongoing diarrhea for the last two weeks that is potentially a side effect of Zoloft. Changing to Citalopram. Follow-up in two weeks.  Recheck sooner if needed.  Refill BuSpar.  PDMP not reviewed this encounter. No orders of the defined types were placed in this encounter.  Meds ordered this encounter  Medications  . DISCONTD: citalopram (CELEXA) 20 MG tablet    Sig: Take 1 tablet (  20 mg total) by mouth daily.    Dispense:  30 tablet    Refill:  3  . busPIRone (BUSPAR) 10 MG tablet    Sig: Take 1 tablet (10 mg total) by mouth 3 (three) times daily.    Dispense:  90 tablet    Refill:  1  . citalopram (CELEXA) 20 MG tablet    Sig: Take 1 tablet (20 mg total) by mouth daily.    Dispense:  30 tablet    Refill:  3    Follow Up Instructions:    I discussed the assessment and treatment plan with the patient. The patient was provided an opportunity to ask questions and all were answered. The patient agreed with the plan and demonstrated an understanding of the instructions.   The patient was advised to call back or seek an in-person evaluation if the symptoms worsen or if the condition fails to improve as anticipated.  Time: 15 minutes of intraservice time, with >22 minutes of total time during today's  visit.      Historical information moved to improve visibility of documentation.  Past Medical History:  Diagnosis Date  . Alcohol abuse 04/15/2018  . GAD (generalized anxiety disorder) 09/13/2017  . MDD (major depressive disorder) 09/13/2017   No past surgical history on file. Social History   Tobacco Use  . Smoking status: Current Every Day Smoker  . Smokeless tobacco: Never Used  Substance Use Topics  . Alcohol use: No   family history includes Depression in his brother.  Medications: Current Outpatient Medications  Medication Sig Dispense Refill  . albuterol (PROVENTIL HFA;VENTOLIN HFA) 108 (90 Base) MCG/ACT inhaler Inhale 2 puffs into the lungs every 4 (four) hours as needed for wheezing or shortness of breath. 1 Inhaler 0  . busPIRone (BUSPAR) 10 MG tablet Take 1 tablet (10 mg total) by mouth 3 (three) times daily. 90 tablet 1  . citalopram (CELEXA) 20 MG tablet Take 1 tablet (20 mg total) by mouth daily. 30 tablet 3   No current facility-administered medications for this visit.    Allergies  Allergen Reactions  . Hydroxyzine     "makes me feel weird"                                                                  I personally was present and performed or re-performed the history, physical exam and medical decision-making activities of this service and have verified that the service and findings are accurately documented in the student's note. ___________________________________________ Lynne Leader M.D., ABFM., CAQSM. Primary Care and Sports Medicine Adjunct Instructor of [employer]

## 2019-04-12 ENCOUNTER — Telehealth: Payer: Self-pay | Admitting: Family Medicine

## 2019-04-12 NOTE — Telephone Encounter (Signed)
-----   Message from Gregor Hams, MD sent at 04/12/2019  6:57 AM EDT ----- Regarding: Schedule 2-week follow-up visit mood telemedicine Please schedule 2  week follow-up visit via telemedicine for mood.  PHQ 9/GAD 7

## 2019-04-12 NOTE — Telephone Encounter (Signed)
Left voicemail with information below. Let patient know to call us back to schedule a virtual visit. °

## 2019-10-12 ENCOUNTER — Ambulatory Visit (INDEPENDENT_AMBULATORY_CARE_PROVIDER_SITE_OTHER): Payer: No Typology Code available for payment source | Admitting: Physician Assistant

## 2019-10-12 VITALS — Temp 97.0°F | Ht 73.0 in | Wt 180.0 lb

## 2019-10-12 DIAGNOSIS — F321 Major depressive disorder, single episode, moderate: Secondary | ICD-10-CM

## 2019-10-12 DIAGNOSIS — F411 Generalized anxiety disorder: Secondary | ICD-10-CM

## 2019-10-12 DIAGNOSIS — J4520 Mild intermittent asthma, uncomplicated: Secondary | ICD-10-CM | POA: Diagnosis not present

## 2019-10-12 DIAGNOSIS — F1010 Alcohol abuse, uncomplicated: Secondary | ICD-10-CM | POA: Diagnosis not present

## 2019-10-12 MED ORDER — BUPROPION HCL ER (XL) 150 MG PO TB24: 150.0000 mg | ORAL_TABLET | ORAL | 0 refills | Status: DC

## 2019-10-12 MED ORDER — BUSPIRONE HCL 10 MG PO TABS: 10.0000 mg | ORAL_TABLET | Freq: Three times a day (TID) | ORAL | 0 refills | Status: DC

## 2019-10-12 MED ORDER — ALBUTEROL SULFATE HFA 108 (90 BASE) MCG/ACT IN AERS: 2.0000 | INHALATION_SPRAY | RESPIRATORY_TRACT | 1 refills | Status: DC | PRN

## 2019-10-12 NOTE — Progress Notes (Signed)
Depression/anxiety - wasn't taking medication consistently in the past PHQ9 (17) -GAD7 (16) completed.

## 2019-10-12 NOTE — Progress Notes (Signed)
Patient ID: Albert Hobbs, male   DOB: [birth year], 23 y.o.   MRN: [id number] .[name] KitchenVirtual Visit via Video Note  I connected with Albert Hobbs on 10/12/19 at  4:20 PM EST by a video enabled telemedicine application and verified that I am speaking with the correct person using two identifiers.  Location: Patient: home Provider: clinic   I discussed the limitations of evaluation and management by telemedicine and the availability of in person appointments. The patient expressed understanding and agreed to proceed.  History of Present Illness: Pt is a 23 yo male with hx of MDD, GAD, alcohol abuse who calls into the clinic to discuss medication. He was put on celexa and buspar in the past but was not compliant with medication. He honestly felt like it "numbed him too much". He recently got out of a toxic relationship. He got a DUI in [place] where he was living. He is currently not drinking alcohol currently and knows he needs help. He is doing AA program and counseling through state due to DUI.  He finds himself down and tearful a lot. He does not have a lot of motivation. Denies any SI/HC. His mother responded well to wellbutrin. He wonders if he would.   .. Active Ambulatory Problems    Diagnosis Date Noted  . MDD (major depressive disorder) 09/13/2017  . GAD (generalized anxiety disorder) 09/13/2017  . Alcohol abuse 04/15/2018  . Reactive airway disease 10/14/2019   Resolved Ambulatory Problems    Diagnosis Date Noted  . Suicidal thoughts 04/15/2018   No Additional Past Medical History   Reviewed med, allergy, problem list.    Observations/Objective: No acute distress. Normal mood and appearance.   .. Today's Vitals   10/12/19 1507  Temp: (!) 97 F (36.1 C)  TempSrc: Oral  Weight: 180 lb (81.6 kg)  Height: 6\' 1"  (1.854 m)   Body mass index is 23.75 kg/m.   .. Depression screen [facility] 2/9 10/12/2019 04/11/2019 04/11/2019 03/08/2019 02/23/2019  Decreased Interest 3 1 1 1 1    Down, Depressed, Hopeless 3 0 1 1 1   PHQ - 2 Score 6 1 2 2 2   Altered sleeping 1 1 1 2 2   Tired, decreased energy 3 1 0 1 0  Change in appetite 3 3 3 2 1   Feeling bad or failure about yourself  3 0 0 1 1  Trouble concentrating 0 1 0 0 0  Moving slowly or fidgety/restless 1 1 2  0 0  Suicidal thoughts 0 0 0 0 0  PHQ-9 Score 17 8 8 8 6   Difficult doing work/chores Somewhat difficult Somewhat difficult Somewhat difficult Somewhat difficult Not difficult at all   .[name] Kitchen GAD 7 : Generalized Anxiety Score 10/12/2019 04/11/2019 03/08/2019 02/23/2019  Nervous, Anxious, on Edge 3 1 1  0  Control/stop worrying 3 1 1  0  Worry too much - different things 3 1 1 1   Trouble relaxing 3 1 2  0  Restless 1 1 2  0  Easily annoyed or irritable 3 1 2 1   Afraid - awful might happen 0 0 0 0  Total GAD 7 Score 16 6 9 2   Anxiety Difficulty Somewhat difficult Somewhat difficult Somewhat difficult Not difficult at all     Assessment and Plan: [name] KitchenMarland KitchenDevantae was seen today for anxiety.  Diagnoses and all orders for this visit:  Current moderate episode of major depressive disorder without prior episode (HCC) -     buPROPion (WELLBUTRIN XL) 150 MG 24 hr tablet; Take 1  tablet (150 mg total) by mouth every morning. -     busPIRone (BUSPAR) 10 MG tablet; Take 1 tablet (10 mg total) by mouth 3 (three) times daily.  Alcohol abuse  GAD (generalized anxiety disorder) -     buPROPion (WELLBUTRIN XL) 150 MG 24 hr tablet; Take 1 tablet (150 mg total) by mouth every morning. -     busPIRone (BUSPAR) 10 MG tablet; Take 1 tablet (10 mg total) by mouth 3 (three) times daily.  Mild intermittent reactive airway disease without complication -     albuterol (VENTOLIN HFA) 108 (90 Base) MCG/ACT inhaler; Inhale 2 puffs into the lungs every 4 (four) hours as needed for wheezing or shortness of breath.  Other orders -     Discontinue: albuterol (VENTOLIN HFA) 108 (90 Base) MCG/ACT inhaler; Inhale 2 puffs into the lungs every 4 (four)  hours as needed for wheezing or shortness of breath. -     Discontinue: busPIRone (BUSPAR) 10 MG tablet; Take 1 tablet (10 mg total) by mouth 3 (three) times daily. -     Discontinue: buPROPion (WELLBUTRIN XL) 150 MG 24 hr tablet; Take 1 tablet (150 mg total) by mouth every morning.   Only concern with starting wellbutrin is alcohol abuse hx and risk of seizures with wellbutrin. Discussed with patient. He is not drinking and wants to stay sober. He is in [place]. He will start counseling soon. Will try wellbutrin and buspar. Follow up in 4 weeks. Encouraged exercise.    Follow Up Instructions:    I discussed the assessment and treatment plan with the patient. The patient was provided an opportunity to ask questions and all were answered. The patient agreed with the plan and demonstrated an understanding of the instructions.   The patient was advised to call back or seek an in-person evaluation if the symptoms worsen or if the condition fails to improve as anticipated.    Tandy Gaw, PA-C

## 2019-10-14 ENCOUNTER — Encounter: Payer: Self-pay | Admitting: Physician Assistant

## 2019-10-14 DIAGNOSIS — J45909 Unspecified asthma, uncomplicated: Secondary | ICD-10-CM | POA: Insufficient documentation

## 2019-11-05 ENCOUNTER — Other Ambulatory Visit: Payer: Self-pay | Admitting: Physician Assistant

## 2019-11-05 DIAGNOSIS — F411 Generalized anxiety disorder: Secondary | ICD-10-CM

## 2019-11-05 DIAGNOSIS — F321 Major depressive disorder, single episode, moderate: Secondary | ICD-10-CM

## 2019-11-08 ENCOUNTER — Telehealth (INDEPENDENT_AMBULATORY_CARE_PROVIDER_SITE_OTHER): Payer: No Typology Code available for payment source | Admitting: Physician Assistant

## 2019-11-08 VITALS — Temp 97.0°F | Ht 73.0 in | Wt 180.0 lb

## 2019-11-08 DIAGNOSIS — F419 Anxiety disorder, unspecified: Secondary | ICD-10-CM

## 2019-11-08 DIAGNOSIS — F39 Unspecified mood [affective] disorder: Secondary | ICD-10-CM

## 2019-11-08 DIAGNOSIS — F321 Major depressive disorder, single episode, moderate: Secondary | ICD-10-CM

## 2019-11-08 MED ORDER — ALPRAZOLAM 0.5 MG PO TABS: 0.5000 mg | ORAL_TABLET | Freq: Every evening | ORAL | 0 refills | Status: DC | PRN

## 2019-11-08 MED ORDER — LAMOTRIGINE 25 MG PO TABS: ORAL_TABLET | ORAL | 1 refills | Status: DC

## 2019-11-08 MED ORDER — ESCITALOPRAM OXALATE 10 MG PO TABS: ORAL_TABLET | ORAL | 1 refills | Status: DC

## 2019-11-08 NOTE — Progress Notes (Signed)
Feels like mood is a little better.  PHQ9 (12 - was 17) -GAD7 (18 - was 16) completed.

## 2019-11-08 NOTE — Progress Notes (Addendum)
Patient ID: Albert Hobbs, male   DOB: [birth year], 24 y.o.   MRN: [id number] .[name] KitchenVirtual Visit via Video Note  I connected with Pat Sires Bognar on 11/08/2019 at 11:30 AM EST by a video enabled telemedicine application and verified that I am speaking with the correct person using two identifiers.  Location: Patient: home Provider: clinic   I discussed the limitations of evaluation and management by telemedicine and the availability of in person appointments. The patient expressed understanding and agreed to proceed.  History of Present Illness: Pt is a 24 yo male with anxiety, depression, irritability who calls in to discuss no improvement with wellbutrin. In fact he feels like anxiety is a little worse.  He is having waves of anxiety that hit him all of a sudden. He admits he took one of his moms xanax. That helped a lot. He did not get any improvement with buspar. He has been sober since moving back to [place] with mother. He admits to not sleeping. No SI/HC thoughts.   .. Active Ambulatory Problems    Diagnosis Date Noted  . MDD (major depressive disorder) 09/13/2017  . GAD (generalized anxiety disorder) 09/13/2017  . Alcohol abuse 04/15/2018  . Reactive airway disease 10/14/2019  . Current moderate episode of major depressive disorder without prior episode (HCC) 11/13/2019  . Anxiety 11/13/2019  . Mood disorder (HCC) 11/13/2019   Resolved Ambulatory Problems    Diagnosis Date Noted  . Suicidal thoughts 04/15/2018   No Additional Past Medical History   Reviewed med, allergy, problem list.      Observations/Objective: No acute distress. Normal mood and appearance today.   .. Today's Vitals   11/08/19 1035  Temp: (!) 97 F (36.1 C)  TempSrc: Oral  Weight: 180 lb (81.6 kg)  Height: 6\' 1"  (1.854 m)   Body mass index is 23.75 kg/m.   .. Depression screen [facility] 2/9 11/08/2019 10/12/2019 04/11/2019 04/11/2019 03/08/2019  Decreased Interest 0 3 1 1 1   Down, Depressed, Hopeless 1 3 0  1 1  PHQ - 2 Score 1 6 1 2 2   Altered sleeping 3 1 1 1 2   Tired, decreased energy 3 3 1  0 1  Change in appetite 1 3 3 3 2   Feeling bad or failure about yourself  0 3 0 0 1  Trouble concentrating 1 0 1 0 0  Moving slowly or fidgety/restless 3 1 1 2  0  Suicidal thoughts 0 0 0 0 0  PHQ-9 Score 12 17 8 8 8   Difficult doing work/chores Very difficult Somewhat difficult Somewhat difficult Somewhat difficult Somewhat difficult  Some recent data might be hidden   .03/10/2019 GAD 7 : Generalized Anxiety Score 11/08/2019 10/12/2019 04/11/2019 03/08/2019  Nervous, Anxious, on Edge 3 3 1 1   Control/stop worrying 3 3 1 1   Worry too much - different things 3 3 1 1   Trouble relaxing 3 3 1 2   Restless 3 1 1 2   Easily annoyed or irritable 3 3 1 2   Afraid - awful might happen 0 0 0 0  Total GAD 7 Score 18 16 6 9   Anxiety Difficulty Very difficult Somewhat difficult Somewhat difficult Somewhat difficult     Assessment and Plan:  Sylvia was seen today for depression.  Diagnoses and all orders for this visit:  Mood disorder (HCC) -     escitalopram (LEXAPRO) 10 MG tablet; Take 1/2 tablet for 7 days then increase to 1 full tablet. -     lamoTRIgine (LAMICTAL) 25 MG  tablet; Take 1 tablet daily for one week then increase to 2 tablets daily for one week then 3 tablet daily for one week then 4 tablets daily.  Current moderate episode of major depressive disorder without prior episode (HCC) -     escitalopram (LEXAPRO) 10 MG tablet; Take 1/2 tablet for 7 days then increase to 1 full tablet. -     lamoTRIgine (LAMICTAL) 25 MG tablet; Take 1 tablet daily for one week then increase to 2 tablets daily for one week then 3 tablet daily for one week then 4 tablets daily.  Anxiety -     ALPRAZolam (XANAX) 0.5 MG tablet; Take 1 tablet (0.5 mg total) by mouth at bedtime as needed for anxiety.   I suspect wellbutrin could have made anxiety worse. Stop wellbutrin and buspar.  Start lexapro and lamictal. I went through  numerous bipolar symptoms and he was highly positive. Adding mood stablizer could help a lot. Xanax small quanity given for as needed use. Pt aware NOT to take with any alcohol and to use sparingly. Dependency is a risk. Follow up in 4 weeks.   Spent 18 minutes with patient and in chart review.   Follow Up Instructions:    I discussed the assessment and treatment plan with the patient. The patient was provided an opportunity to ask questions and all were answered. The patient agreed with the plan and demonstrated an understanding of the instructions.   The patient was advised to call back or seek an in-person evaluation if the symptoms worsen or if the condition fails to improve as anticipated.   Iran Planas, PA-C

## 2019-11-13 ENCOUNTER — Encounter: Payer: Self-pay | Admitting: Physician Assistant

## 2019-11-13 DIAGNOSIS — F321 Major depressive disorder, single episode, moderate: Secondary | ICD-10-CM | POA: Insufficient documentation

## 2019-11-13 DIAGNOSIS — F419 Anxiety disorder, unspecified: Secondary | ICD-10-CM | POA: Insufficient documentation

## 2019-11-13 DIAGNOSIS — F39 Unspecified mood [affective] disorder: Secondary | ICD-10-CM | POA: Insufficient documentation

## 2020-01-04 ENCOUNTER — Other Ambulatory Visit: Payer: Self-pay | Admitting: Physician Assistant

## 2020-01-04 DIAGNOSIS — F321 Major depressive disorder, single episode, moderate: Secondary | ICD-10-CM

## 2020-01-04 DIAGNOSIS — F411 Generalized anxiety disorder: Secondary | ICD-10-CM

## 2020-01-15 ENCOUNTER — Encounter: Payer: Self-pay | Admitting: Physician Assistant

## 2020-01-16 ENCOUNTER — Encounter: Payer: Self-pay | Admitting: Physician Assistant

## 2020-01-16 DIAGNOSIS — F321 Major depressive disorder, single episode, moderate: Secondary | ICD-10-CM

## 2020-01-16 DIAGNOSIS — F39 Unspecified mood [affective] disorder: Secondary | ICD-10-CM

## 2020-01-16 MED ORDER — ESCITALOPRAM OXALATE 10 MG PO TABS: ORAL_TABLET | ORAL | 1 refills | Status: DC

## 2020-01-16 MED ORDER — LAMOTRIGINE 100 MG PO TABS: 100.0000 mg | ORAL_TABLET | Freq: Every day | ORAL | 1 refills | Status: DC

## 2020-01-19 ENCOUNTER — Other Ambulatory Visit: Payer: Self-pay | Admitting: Physician Assistant

## 2020-01-19 DIAGNOSIS — J4520 Mild intermittent asthma, uncomplicated: Secondary | ICD-10-CM

## 2020-01-19 MED ORDER — ALBUTEROL SULFATE HFA 108 (90 BASE) MCG/ACT IN AERS: 2.0000 | INHALATION_SPRAY | RESPIRATORY_TRACT | 2 refills | Status: DC | PRN

## 2020-01-19 NOTE — Progress Notes (Signed)
Needed 90 day refills sent.

## 2020-02-28 ENCOUNTER — Other Ambulatory Visit: Payer: Self-pay | Admitting: Physician Assistant

## 2020-02-28 DIAGNOSIS — J4520 Mild intermittent asthma, uncomplicated: Secondary | ICD-10-CM

## 2020-04-30 ENCOUNTER — Other Ambulatory Visit: Payer: Self-pay | Admitting: Physician Assistant

## 2020-04-30 DIAGNOSIS — J4520 Mild intermittent asthma, uncomplicated: Secondary | ICD-10-CM

## 2020-05-21 ENCOUNTER — Other Ambulatory Visit: Payer: Self-pay | Admitting: Physician Assistant

## 2020-05-21 DIAGNOSIS — J4520 Mild intermittent asthma, uncomplicated: Secondary | ICD-10-CM

## 2020-05-27 DIAGNOSIS — J029 Acute pharyngitis, unspecified: Secondary | ICD-10-CM | POA: Diagnosis not present

## 2020-05-27 DIAGNOSIS — J039 Acute tonsillitis, unspecified: Secondary | ICD-10-CM | POA: Diagnosis not present

## 2020-05-30 ENCOUNTER — Encounter: Payer: Self-pay | Admitting: Physician Assistant

## 2020-09-19 DIAGNOSIS — Z209 Contact with and (suspected) exposure to unspecified communicable disease: Secondary | ICD-10-CM | POA: Diagnosis not present

## 2020-09-28 DIAGNOSIS — M4607 Spinal enthesopathy, lumbosacral region: Secondary | ICD-10-CM | POA: Diagnosis not present

## 2020-09-28 DIAGNOSIS — Z6823 Body mass index (BMI) 23.0-23.9, adult: Secondary | ICD-10-CM | POA: Diagnosis not present

## 2020-09-28 DIAGNOSIS — M25612 Stiffness of left shoulder, not elsewhere classified: Secondary | ICD-10-CM | POA: Diagnosis not present

## 2020-09-28 DIAGNOSIS — M5387 Other specified dorsopathies, lumbosacral region: Secondary | ICD-10-CM | POA: Diagnosis not present

## 2020-09-28 DIAGNOSIS — E860 Dehydration: Secondary | ICD-10-CM | POA: Diagnosis not present

## 2020-09-28 DIAGNOSIS — R635 Abnormal weight gain: Secondary | ICD-10-CM | POA: Diagnosis not present

## 2020-09-28 DIAGNOSIS — M9902 Segmental and somatic dysfunction of thoracic region: Secondary | ICD-10-CM | POA: Diagnosis not present

## 2020-09-28 DIAGNOSIS — Z713 Dietary counseling and surveillance: Secondary | ICD-10-CM | POA: Diagnosis not present

## 2020-09-28 DIAGNOSIS — G44209 Tension-type headache, unspecified, not intractable: Secondary | ICD-10-CM | POA: Diagnosis not present

## 2020-09-28 DIAGNOSIS — M25611 Stiffness of right shoulder, not elsewhere classified: Secondary | ICD-10-CM | POA: Diagnosis not present

## 2020-09-28 DIAGNOSIS — M4608 Spinal enthesopathy, sacral and sacrococcygeal region: Secondary | ICD-10-CM | POA: Diagnosis not present

## 2020-09-28 DIAGNOSIS — F1020 Alcohol dependence, uncomplicated: Secondary | ICD-10-CM | POA: Diagnosis not present

## 2020-09-28 DIAGNOSIS — M5388 Other specified dorsopathies, sacral and sacrococcygeal region: Secondary | ICD-10-CM | POA: Diagnosis not present

## 2020-09-28 DIAGNOSIS — M9901 Segmental and somatic dysfunction of cervical region: Secondary | ICD-10-CM | POA: Diagnosis not present

## 2020-09-28 DIAGNOSIS — M4604 Spinal enthesopathy, thoracic region: Secondary | ICD-10-CM | POA: Diagnosis not present

## 2020-09-28 DIAGNOSIS — Z724 Inappropriate diet and eating habits: Secondary | ICD-10-CM | POA: Diagnosis not present

## 2020-09-28 DIAGNOSIS — M62838 Other muscle spasm: Secondary | ICD-10-CM | POA: Diagnosis not present

## 2020-10-02 DIAGNOSIS — M9901 Segmental and somatic dysfunction of cervical region: Secondary | ICD-10-CM | POA: Diagnosis not present

## 2020-10-02 DIAGNOSIS — M9902 Segmental and somatic dysfunction of thoracic region: Secondary | ICD-10-CM | POA: Diagnosis not present

## 2020-10-02 DIAGNOSIS — M5388 Other specified dorsopathies, sacral and sacrococcygeal region: Secondary | ICD-10-CM | POA: Diagnosis not present

## 2020-10-02 DIAGNOSIS — M25612 Stiffness of left shoulder, not elsewhere classified: Secondary | ICD-10-CM | POA: Diagnosis not present

## 2020-10-02 DIAGNOSIS — M25611 Stiffness of right shoulder, not elsewhere classified: Secondary | ICD-10-CM | POA: Diagnosis not present

## 2020-10-02 DIAGNOSIS — M4607 Spinal enthesopathy, lumbosacral region: Secondary | ICD-10-CM | POA: Diagnosis not present

## 2020-10-02 DIAGNOSIS — M5387 Other specified dorsopathies, lumbosacral region: Secondary | ICD-10-CM | POA: Diagnosis not present

## 2020-10-02 DIAGNOSIS — M4608 Spinal enthesopathy, sacral and sacrococcygeal region: Secondary | ICD-10-CM | POA: Diagnosis not present

## 2020-10-02 DIAGNOSIS — M4604 Spinal enthesopathy, thoracic region: Secondary | ICD-10-CM | POA: Diagnosis not present

## 2020-10-02 DIAGNOSIS — Z713 Dietary counseling and surveillance: Secondary | ICD-10-CM | POA: Diagnosis not present

## 2020-10-03 DIAGNOSIS — M5387 Other specified dorsopathies, lumbosacral region: Secondary | ICD-10-CM | POA: Diagnosis not present

## 2020-10-03 DIAGNOSIS — M62838 Other muscle spasm: Secondary | ICD-10-CM | POA: Diagnosis not present

## 2020-10-03 DIAGNOSIS — M5388 Other specified dorsopathies, sacral and sacrococcygeal region: Secondary | ICD-10-CM | POA: Diagnosis not present

## 2020-10-03 DIAGNOSIS — M4608 Spinal enthesopathy, sacral and sacrococcygeal region: Secondary | ICD-10-CM | POA: Diagnosis not present

## 2020-10-03 DIAGNOSIS — M4607 Spinal enthesopathy, lumbosacral region: Secondary | ICD-10-CM | POA: Diagnosis not present

## 2020-10-07 DIAGNOSIS — M25611 Stiffness of right shoulder, not elsewhere classified: Secondary | ICD-10-CM | POA: Diagnosis not present

## 2020-10-07 DIAGNOSIS — M4608 Spinal enthesopathy, sacral and sacrococcygeal region: Secondary | ICD-10-CM | POA: Diagnosis not present

## 2020-10-07 DIAGNOSIS — M5387 Other specified dorsopathies, lumbosacral region: Secondary | ICD-10-CM | POA: Diagnosis not present

## 2020-10-07 DIAGNOSIS — M9901 Segmental and somatic dysfunction of cervical region: Secondary | ICD-10-CM | POA: Diagnosis not present

## 2020-10-07 DIAGNOSIS — M4604 Spinal enthesopathy, thoracic region: Secondary | ICD-10-CM | POA: Diagnosis not present

## 2020-10-07 DIAGNOSIS — M5388 Other specified dorsopathies, sacral and sacrococcygeal region: Secondary | ICD-10-CM | POA: Diagnosis not present

## 2020-10-07 DIAGNOSIS — M4607 Spinal enthesopathy, lumbosacral region: Secondary | ICD-10-CM | POA: Diagnosis not present

## 2020-10-07 DIAGNOSIS — M9902 Segmental and somatic dysfunction of thoracic region: Secondary | ICD-10-CM | POA: Diagnosis not present

## 2020-10-07 DIAGNOSIS — M25612 Stiffness of left shoulder, not elsewhere classified: Secondary | ICD-10-CM | POA: Diagnosis not present

## 2020-10-08 DIAGNOSIS — M4608 Spinal enthesopathy, sacral and sacrococcygeal region: Secondary | ICD-10-CM | POA: Diagnosis not present

## 2020-10-08 DIAGNOSIS — M5388 Other specified dorsopathies, sacral and sacrococcygeal region: Secondary | ICD-10-CM | POA: Diagnosis not present

## 2020-10-08 DIAGNOSIS — M5387 Other specified dorsopathies, lumbosacral region: Secondary | ICD-10-CM | POA: Diagnosis not present

## 2020-10-08 DIAGNOSIS — M4604 Spinal enthesopathy, thoracic region: Secondary | ICD-10-CM | POA: Diagnosis not present

## 2020-10-08 DIAGNOSIS — M4607 Spinal enthesopathy, lumbosacral region: Secondary | ICD-10-CM | POA: Diagnosis not present

## 2020-10-08 DIAGNOSIS — M25612 Stiffness of left shoulder, not elsewhere classified: Secondary | ICD-10-CM | POA: Diagnosis not present

## 2020-10-08 DIAGNOSIS — M9901 Segmental and somatic dysfunction of cervical region: Secondary | ICD-10-CM | POA: Diagnosis not present

## 2020-10-08 DIAGNOSIS — M25611 Stiffness of right shoulder, not elsewhere classified: Secondary | ICD-10-CM | POA: Diagnosis not present

## 2020-10-08 DIAGNOSIS — M9902 Segmental and somatic dysfunction of thoracic region: Secondary | ICD-10-CM | POA: Diagnosis not present

## 2020-10-09 DIAGNOSIS — M4607 Spinal enthesopathy, lumbosacral region: Secondary | ICD-10-CM | POA: Diagnosis not present

## 2020-10-09 DIAGNOSIS — M4608 Spinal enthesopathy, sacral and sacrococcygeal region: Secondary | ICD-10-CM | POA: Diagnosis not present

## 2020-10-09 DIAGNOSIS — M5388 Other specified dorsopathies, sacral and sacrococcygeal region: Secondary | ICD-10-CM | POA: Diagnosis not present

## 2020-10-09 DIAGNOSIS — M5387 Other specified dorsopathies, lumbosacral region: Secondary | ICD-10-CM | POA: Diagnosis not present

## 2020-10-10 DIAGNOSIS — M5388 Other specified dorsopathies, sacral and sacrococcygeal region: Secondary | ICD-10-CM | POA: Diagnosis not present

## 2020-10-10 DIAGNOSIS — M4607 Spinal enthesopathy, lumbosacral region: Secondary | ICD-10-CM | POA: Diagnosis not present

## 2020-10-10 DIAGNOSIS — M4608 Spinal enthesopathy, sacral and sacrococcygeal region: Secondary | ICD-10-CM | POA: Diagnosis not present

## 2020-10-10 DIAGNOSIS — R635 Abnormal weight gain: Secondary | ICD-10-CM | POA: Diagnosis not present

## 2020-10-10 DIAGNOSIS — E860 Dehydration: Secondary | ICD-10-CM | POA: Diagnosis not present

## 2020-10-10 DIAGNOSIS — Z724 Inappropriate diet and eating habits: Secondary | ICD-10-CM | POA: Diagnosis not present

## 2020-10-10 DIAGNOSIS — M25612 Stiffness of left shoulder, not elsewhere classified: Secondary | ICD-10-CM | POA: Diagnosis not present

## 2020-10-10 DIAGNOSIS — M5387 Other specified dorsopathies, lumbosacral region: Secondary | ICD-10-CM | POA: Diagnosis not present

## 2020-10-10 DIAGNOSIS — Z6823 Body mass index (BMI) 23.0-23.9, adult: Secondary | ICD-10-CM | POA: Diagnosis not present

## 2020-10-10 DIAGNOSIS — M25611 Stiffness of right shoulder, not elsewhere classified: Secondary | ICD-10-CM | POA: Diagnosis not present

## 2020-10-14 DIAGNOSIS — M4604 Spinal enthesopathy, thoracic region: Secondary | ICD-10-CM | POA: Diagnosis not present

## 2020-10-14 DIAGNOSIS — M9902 Segmental and somatic dysfunction of thoracic region: Secondary | ICD-10-CM | POA: Diagnosis not present

## 2020-10-14 DIAGNOSIS — M5388 Other specified dorsopathies, sacral and sacrococcygeal region: Secondary | ICD-10-CM | POA: Diagnosis not present

## 2020-10-14 DIAGNOSIS — M5387 Other specified dorsopathies, lumbosacral region: Secondary | ICD-10-CM | POA: Diagnosis not present

## 2020-10-14 DIAGNOSIS — M4607 Spinal enthesopathy, lumbosacral region: Secondary | ICD-10-CM | POA: Diagnosis not present

## 2020-10-14 DIAGNOSIS — M9901 Segmental and somatic dysfunction of cervical region: Secondary | ICD-10-CM | POA: Diagnosis not present

## 2020-10-14 DIAGNOSIS — G44209 Tension-type headache, unspecified, not intractable: Secondary | ICD-10-CM | POA: Diagnosis not present

## 2020-10-14 DIAGNOSIS — M4608 Spinal enthesopathy, sacral and sacrococcygeal region: Secondary | ICD-10-CM | POA: Diagnosis not present

## 2020-10-14 DIAGNOSIS — M25611 Stiffness of right shoulder, not elsewhere classified: Secondary | ICD-10-CM | POA: Diagnosis not present

## 2020-10-15 DIAGNOSIS — M9901 Segmental and somatic dysfunction of cervical region: Secondary | ICD-10-CM | POA: Diagnosis not present

## 2020-10-15 DIAGNOSIS — M25612 Stiffness of left shoulder, not elsewhere classified: Secondary | ICD-10-CM | POA: Diagnosis not present

## 2020-10-15 DIAGNOSIS — M25611 Stiffness of right shoulder, not elsewhere classified: Secondary | ICD-10-CM | POA: Diagnosis not present

## 2020-10-15 DIAGNOSIS — M4604 Spinal enthesopathy, thoracic region: Secondary | ICD-10-CM | POA: Diagnosis not present

## 2020-10-15 DIAGNOSIS — M5388 Other specified dorsopathies, sacral and sacrococcygeal region: Secondary | ICD-10-CM | POA: Diagnosis not present

## 2020-10-15 DIAGNOSIS — M5387 Other specified dorsopathies, lumbosacral region: Secondary | ICD-10-CM | POA: Diagnosis not present

## 2020-10-15 DIAGNOSIS — G44209 Tension-type headache, unspecified, not intractable: Secondary | ICD-10-CM | POA: Diagnosis not present

## 2020-10-15 DIAGNOSIS — M4607 Spinal enthesopathy, lumbosacral region: Secondary | ICD-10-CM | POA: Diagnosis not present

## 2020-10-15 DIAGNOSIS — M4608 Spinal enthesopathy, sacral and sacrococcygeal region: Secondary | ICD-10-CM | POA: Diagnosis not present

## 2020-10-15 DIAGNOSIS — M9902 Segmental and somatic dysfunction of thoracic region: Secondary | ICD-10-CM | POA: Diagnosis not present

## 2020-10-16 DIAGNOSIS — M25611 Stiffness of right shoulder, not elsewhere classified: Secondary | ICD-10-CM | POA: Diagnosis not present

## 2020-10-16 DIAGNOSIS — M9902 Segmental and somatic dysfunction of thoracic region: Secondary | ICD-10-CM | POA: Diagnosis not present

## 2020-10-16 DIAGNOSIS — M25612 Stiffness of left shoulder, not elsewhere classified: Secondary | ICD-10-CM | POA: Diagnosis not present

## 2020-10-16 DIAGNOSIS — M4604 Spinal enthesopathy, thoracic region: Secondary | ICD-10-CM | POA: Diagnosis not present

## 2020-10-16 DIAGNOSIS — M5388 Other specified dorsopathies, sacral and sacrococcygeal region: Secondary | ICD-10-CM | POA: Diagnosis not present

## 2020-10-16 DIAGNOSIS — M4608 Spinal enthesopathy, sacral and sacrococcygeal region: Secondary | ICD-10-CM | POA: Diagnosis not present

## 2020-10-16 DIAGNOSIS — M9901 Segmental and somatic dysfunction of cervical region: Secondary | ICD-10-CM | POA: Diagnosis not present

## 2020-10-16 DIAGNOSIS — M5387 Other specified dorsopathies, lumbosacral region: Secondary | ICD-10-CM | POA: Diagnosis not present

## 2020-10-16 DIAGNOSIS — M4607 Spinal enthesopathy, lumbosacral region: Secondary | ICD-10-CM | POA: Diagnosis not present

## 2020-10-17 DIAGNOSIS — M25611 Stiffness of right shoulder, not elsewhere classified: Secondary | ICD-10-CM | POA: Diagnosis not present

## 2020-10-17 DIAGNOSIS — M9902 Segmental and somatic dysfunction of thoracic region: Secondary | ICD-10-CM | POA: Diagnosis not present

## 2020-10-17 DIAGNOSIS — M5388 Other specified dorsopathies, sacral and sacrococcygeal region: Secondary | ICD-10-CM | POA: Diagnosis not present

## 2020-10-17 DIAGNOSIS — M4608 Spinal enthesopathy, sacral and sacrococcygeal region: Secondary | ICD-10-CM | POA: Diagnosis not present

## 2020-10-17 DIAGNOSIS — R278 Other lack of coordination: Secondary | ICD-10-CM | POA: Diagnosis not present

## 2020-10-17 DIAGNOSIS — M9901 Segmental and somatic dysfunction of cervical region: Secondary | ICD-10-CM | POA: Diagnosis not present

## 2020-10-17 DIAGNOSIS — M5387 Other specified dorsopathies, lumbosacral region: Secondary | ICD-10-CM | POA: Diagnosis not present

## 2020-10-17 DIAGNOSIS — M4607 Spinal enthesopathy, lumbosacral region: Secondary | ICD-10-CM | POA: Diagnosis not present

## 2020-10-17 DIAGNOSIS — M25612 Stiffness of left shoulder, not elsewhere classified: Secondary | ICD-10-CM | POA: Diagnosis not present

## 2021-03-26 DIAGNOSIS — F331 Major depressive disorder, recurrent, moderate: Secondary | ICD-10-CM | POA: Diagnosis not present

## 2021-03-26 DIAGNOSIS — S62339A Displaced fracture of neck of unspecified metacarpal bone, initial encounter for closed fracture: Secondary | ICD-10-CM | POA: Diagnosis not present

## 2021-04-02 DIAGNOSIS — S62326D Displaced fracture of shaft of fifth metacarpal bone, right hand, subsequent encounter for fracture with routine healing: Secondary | ICD-10-CM | POA: Diagnosis not present

## 2021-04-04 DIAGNOSIS — M25841 Other specified joint disorders, right hand: Secondary | ICD-10-CM | POA: Diagnosis not present

## 2021-04-04 DIAGNOSIS — G8918 Other acute postprocedural pain: Secondary | ICD-10-CM | POA: Diagnosis not present

## 2021-04-04 DIAGNOSIS — S62396A Other fracture of fifth metacarpal bone, right hand, initial encounter for closed fracture: Secondary | ICD-10-CM | POA: Diagnosis not present

## 2021-04-04 DIAGNOSIS — S62336A Displaced fracture of neck of fifth metacarpal bone, right hand, initial encounter for closed fracture: Secondary | ICD-10-CM | POA: Diagnosis not present

## 2021-04-04 DIAGNOSIS — J45909 Unspecified asthma, uncomplicated: Secondary | ICD-10-CM | POA: Diagnosis not present

## 2021-04-04 DIAGNOSIS — Z79899 Other long term (current) drug therapy: Secondary | ICD-10-CM | POA: Diagnosis not present

## 2021-04-04 DIAGNOSIS — S62300A Unspecified fracture of second metacarpal bone, right hand, initial encounter for closed fracture: Secondary | ICD-10-CM | POA: Diagnosis not present

## 2021-04-04 DIAGNOSIS — M79641 Pain in right hand: Secondary | ICD-10-CM | POA: Diagnosis not present

## 2021-04-04 DIAGNOSIS — F319 Bipolar disorder, unspecified: Secondary | ICD-10-CM | POA: Diagnosis not present

## 2021-04-04 DIAGNOSIS — R52 Pain, unspecified: Secondary | ICD-10-CM | POA: Diagnosis not present

## 2021-04-04 DIAGNOSIS — F17210 Nicotine dependence, cigarettes, uncomplicated: Secondary | ICD-10-CM | POA: Diagnosis not present

## 2021-04-04 DIAGNOSIS — F419 Anxiety disorder, unspecified: Secondary | ICD-10-CM | POA: Diagnosis not present

## 2021-04-04 DIAGNOSIS — S62318A Displaced fracture of base of other metacarpal bone, initial encounter for closed fracture: Secondary | ICD-10-CM | POA: Diagnosis not present

## 2021-04-04 DIAGNOSIS — W2209XA Striking against other stationary object, initial encounter: Secondary | ICD-10-CM | POA: Diagnosis not present

## 2021-04-04 DIAGNOSIS — X58XXXA Exposure to other specified factors, initial encounter: Secondary | ICD-10-CM | POA: Diagnosis not present

## 2021-04-07 DIAGNOSIS — F3131 Bipolar disorder, current episode depressed, mild: Secondary | ICD-10-CM | POA: Insufficient documentation

## 2021-04-07 DIAGNOSIS — Z09 Encounter for follow-up examination after completed treatment for conditions other than malignant neoplasm: Secondary | ICD-10-CM | POA: Diagnosis not present

## 2021-04-07 DIAGNOSIS — S62326S Displaced fracture of shaft of fifth metacarpal bone, right hand, sequela: Secondary | ICD-10-CM | POA: Diagnosis not present

## 2021-04-15 DIAGNOSIS — R0902 Hypoxemia: Secondary | ICD-10-CM | POA: Diagnosis not present

## 2021-04-15 DIAGNOSIS — T1491XA Suicide attempt, initial encounter: Secondary | ICD-10-CM | POA: Diagnosis not present

## 2021-04-15 DIAGNOSIS — Z20822 Contact with and (suspected) exposure to covid-19: Secondary | ICD-10-CM | POA: Diagnosis not present

## 2021-04-15 DIAGNOSIS — F329 Major depressive disorder, single episode, unspecified: Secondary | ICD-10-CM | POA: Diagnosis not present

## 2021-04-15 DIAGNOSIS — G2579 Other drug induced movement disorders: Secondary | ICD-10-CM | POA: Diagnosis not present

## 2021-04-15 DIAGNOSIS — F411 Generalized anxiety disorder: Secondary | ICD-10-CM | POA: Diagnosis not present

## 2021-04-15 DIAGNOSIS — J9600 Acute respiratory failure, unspecified whether with hypoxia or hypercapnia: Secondary | ICD-10-CM | POA: Diagnosis not present

## 2021-04-15 DIAGNOSIS — F322 Major depressive disorder, single episode, severe without psychotic features: Secondary | ICD-10-CM | POA: Diagnosis not present

## 2021-04-15 DIAGNOSIS — T43222A Poisoning by selective serotonin reuptake inhibitors, intentional self-harm, initial encounter: Secondary | ICD-10-CM | POA: Diagnosis not present

## 2021-04-15 DIAGNOSIS — Z4682 Encounter for fitting and adjustment of non-vascular catheter: Secondary | ICD-10-CM | POA: Diagnosis not present

## 2021-04-15 DIAGNOSIS — T426X2A Poisoning by other antiepileptic and sedative-hypnotic drugs, intentional self-harm, initial encounter: Secondary | ICD-10-CM | POA: Diagnosis not present

## 2021-04-15 DIAGNOSIS — I10 Essential (primary) hypertension: Secondary | ICD-10-CM | POA: Diagnosis not present

## 2021-04-15 DIAGNOSIS — T43225A Adverse effect of selective serotonin reuptake inhibitors, initial encounter: Secondary | ICD-10-CM | POA: Diagnosis not present

## 2021-04-15 DIAGNOSIS — F419 Anxiety disorder, unspecified: Secondary | ICD-10-CM | POA: Diagnosis not present

## 2021-04-15 DIAGNOSIS — R0602 Shortness of breath: Secondary | ICD-10-CM | POA: Diagnosis not present

## 2021-04-15 DIAGNOSIS — R0603 Acute respiratory distress: Secondary | ICD-10-CM | POA: Diagnosis not present

## 2021-04-15 DIAGNOSIS — Z79899 Other long term (current) drug therapy: Secondary | ICD-10-CM | POA: Diagnosis not present

## 2021-04-15 DIAGNOSIS — R579 Shock, unspecified: Secondary | ICD-10-CM | POA: Diagnosis not present

## 2021-04-15 DIAGNOSIS — T43592A Poisoning by other antipsychotics and neuroleptics, intentional self-harm, initial encounter: Secondary | ICD-10-CM | POA: Diagnosis not present

## 2021-04-15 DIAGNOSIS — F17210 Nicotine dependence, cigarettes, uncomplicated: Secondary | ICD-10-CM | POA: Diagnosis not present

## 2021-04-15 DIAGNOSIS — F3189 Other bipolar disorder: Secondary | ICD-10-CM | POA: Diagnosis not present

## 2021-04-15 DIAGNOSIS — F10229 Alcohol dependence with intoxication, unspecified: Secondary | ICD-10-CM | POA: Diagnosis not present

## 2021-04-15 DIAGNOSIS — R4182 Altered mental status, unspecified: Secondary | ICD-10-CM | POA: Diagnosis not present

## 2021-04-15 DIAGNOSIS — T50902A Poisoning by unspecified drugs, medicaments and biological substances, intentional self-harm, initial encounter: Secondary | ICD-10-CM | POA: Diagnosis not present

## 2021-04-15 DIAGNOSIS — F319 Bipolar disorder, unspecified: Secondary | ICD-10-CM | POA: Diagnosis not present

## 2021-04-15 DIAGNOSIS — R000 Tachycardia, unspecified: Secondary | ICD-10-CM | POA: Diagnosis not present

## 2021-04-15 DIAGNOSIS — T887XXA Unspecified adverse effect of drug or medicament, initial encounter: Secondary | ICD-10-CM | POA: Diagnosis not present

## 2021-04-15 DIAGNOSIS — F1020 Alcohol dependence, uncomplicated: Secondary | ICD-10-CM | POA: Diagnosis not present

## 2021-04-15 DIAGNOSIS — F1910 Other psychoactive substance abuse, uncomplicated: Secondary | ICD-10-CM | POA: Diagnosis not present

## 2021-04-15 DIAGNOSIS — R569 Unspecified convulsions: Secondary | ICD-10-CM | POA: Diagnosis not present

## 2021-04-15 DIAGNOSIS — E860 Dehydration: Secondary | ICD-10-CM | POA: Diagnosis not present

## 2021-04-15 DIAGNOSIS — T50992A Poisoning by other drugs, medicaments and biological substances, intentional self-harm, initial encounter: Secondary | ICD-10-CM | POA: Diagnosis not present

## 2021-04-15 DIAGNOSIS — Z452 Encounter for adjustment and management of vascular access device: Secondary | ICD-10-CM | POA: Diagnosis not present

## 2021-04-15 DIAGNOSIS — S62306A Unspecified fracture of fifth metacarpal bone, right hand, initial encounter for closed fracture: Secondary | ICD-10-CM | POA: Diagnosis not present

## 2021-04-15 DIAGNOSIS — J9690 Respiratory failure, unspecified, unspecified whether with hypoxia or hypercapnia: Secondary | ICD-10-CM | POA: Diagnosis not present

## 2021-04-15 DIAGNOSIS — G928 Other toxic encephalopathy: Secondary | ICD-10-CM | POA: Diagnosis not present

## 2021-04-15 DIAGNOSIS — R404 Transient alteration of awareness: Secondary | ICD-10-CM | POA: Diagnosis not present

## 2021-04-19 DIAGNOSIS — J9600 Acute respiratory failure, unspecified whether with hypoxia or hypercapnia: Secondary | ICD-10-CM | POA: Insufficient documentation

## 2021-04-26 DIAGNOSIS — J9602 Acute respiratory failure with hypercapnia: Secondary | ICD-10-CM | POA: Diagnosis not present

## 2021-04-26 DIAGNOSIS — F1020 Alcohol dependence, uncomplicated: Secondary | ICD-10-CM | POA: Diagnosis not present

## 2021-04-26 DIAGNOSIS — F411 Generalized anxiety disorder: Secondary | ICD-10-CM | POA: Diagnosis not present

## 2021-04-26 DIAGNOSIS — Z62810 Personal history of physical and sexual abuse in childhood: Secondary | ICD-10-CM | POA: Diagnosis not present

## 2021-04-26 DIAGNOSIS — F332 Major depressive disorder, recurrent severe without psychotic features: Secondary | ICD-10-CM | POA: Diagnosis not present

## 2021-04-26 DIAGNOSIS — T50902A Poisoning by unspecified drugs, medicaments and biological substances, intentional self-harm, initial encounter: Secondary | ICD-10-CM | POA: Diagnosis not present

## 2021-04-26 DIAGNOSIS — J9601 Acute respiratory failure with hypoxia: Secondary | ICD-10-CM | POA: Diagnosis not present

## 2021-04-26 DIAGNOSIS — J45909 Unspecified asthma, uncomplicated: Secondary | ICD-10-CM | POA: Diagnosis not present

## 2021-04-26 DIAGNOSIS — Z62811 Personal history of psychological abuse in childhood: Secondary | ICD-10-CM | POA: Diagnosis not present

## 2021-04-26 DIAGNOSIS — R45851 Suicidal ideations: Secondary | ICD-10-CM | POA: Diagnosis not present

## 2021-04-30 DIAGNOSIS — F332 Major depressive disorder, recurrent severe without psychotic features: Secondary | ICD-10-CM | POA: Diagnosis not present

## 2021-05-21 ENCOUNTER — Other Ambulatory Visit: Payer: Self-pay | Admitting: Family Medicine

## 2021-05-21 ENCOUNTER — Encounter: Payer: Self-pay | Admitting: Family Medicine

## 2021-05-21 ENCOUNTER — Ambulatory Visit (INDEPENDENT_AMBULATORY_CARE_PROVIDER_SITE_OTHER): Payer: BC Managed Care – PPO | Admitting: Family Medicine

## 2021-05-21 VITALS — BP 128/70 | HR 93 | Temp 98.7°F | Ht 73.0 in | Wt 164.4 lb

## 2021-05-21 DIAGNOSIS — F321 Major depressive disorder, single episode, moderate: Secondary | ICD-10-CM

## 2021-05-21 DIAGNOSIS — F411 Generalized anxiety disorder: Secondary | ICD-10-CM | POA: Diagnosis not present

## 2021-05-21 DIAGNOSIS — F39 Unspecified mood [affective] disorder: Secondary | ICD-10-CM

## 2021-05-21 DIAGNOSIS — F1010 Alcohol abuse, uncomplicated: Secondary | ICD-10-CM | POA: Diagnosis not present

## 2021-05-21 DIAGNOSIS — F5104 Psychophysiologic insomnia: Secondary | ICD-10-CM

## 2021-05-21 DIAGNOSIS — G4700 Insomnia, unspecified: Secondary | ICD-10-CM | POA: Insufficient documentation

## 2021-05-21 MED ORDER — ESCITALOPRAM OXALATE 10 MG PO TABS: 15.0000 mg | ORAL_TABLET | Freq: Every day | ORAL | 1 refills | Status: DC

## 2021-05-21 MED ORDER — TRAZODONE HCL 50 MG PO TABS: 50.0000 mg | ORAL_TABLET | Freq: Every day | ORAL | 1 refills | Status: DC

## 2021-05-21 MED ORDER — LAMOTRIGINE 100 MG PO TABS: 100.0000 mg | ORAL_TABLET | Freq: Two times a day (BID) | ORAL | 1 refills | Status: DC

## 2021-05-21 MED ORDER — BUSPIRONE HCL 10 MG PO TABS: 10.0000 mg | ORAL_TABLET | Freq: Two times a day (BID) | ORAL | 1 refills | Status: DC | PRN

## 2021-05-21 NOTE — Assessment & Plan Note (Signed)
Still has some occasional cravings but overall is doing well with naltrexone.  He will continue this for now.

## 2021-05-21 NOTE — Assessment & Plan Note (Signed)
Depressive symptoms have improved significantly however he continues to have quite a bit of anxiety and insomnia.  We will have him increase his Lexapro to 15 mg daily.  Increasing Lamictal to 100 mg twice per day.  Adding BuSpar as needed for breakthrough anxiety.  He is agreeable to seeing a therapist as long as we get him set up with a male therapist.  Referral placed.

## 2021-05-21 NOTE — Assessment & Plan Note (Signed)
Insomnia related to anxiety, history of alcohol abuse likely contributing as well.  Adding trazodone at bedtime.

## 2021-05-21 NOTE — Progress Notes (Signed)
Albert Hobbs - 25 y.o. male MRN [id number]  Date of birth: 01/02/1996  Subjective Chief Complaint  Patient presents with   Hospitalization Follow-up    HPI Albert Hobbs is a 25 year old male here today for initial visit with me.  He was previously seeing Albert Hobbs.  He has a history of depression and anxiety.  He has had some problems with alcohol dependence as well.  He was recently hospitalized for suicide attempt by overdosing on medication.  He was discharged with Lexapro and Lamictal.  He was also started on naltrexone to help with alcohol dependence and cravings.  He is not currently seeing a psychiatrist or therapist.  He has seen a therapist briefly in the past however this was a male therapist, and he reports that he prefers to see male providers.  States he finds them easier to talk to.  He has been through detox and rehab for alcohol in the past.  Since discharge from the hospital he reports improvements in his mood with current medications however he continues to have quite a bit of difficulty with sleep.  He has been able to abstain from alcohol since discharge however he does have some cravings.  He does feel like being away from friends and environment in [place] has been beneficial for his recovery.  His UDS was positive for THC in the hospital however he does admit to using delta 8 THC.  In regards to his sleep he has been using up to 15 mg of melatonin as well as Benadryl.  His mother did have some hydroxyzine which he did find somewhat helpful.  He has never really tried anything else other than this for sleep.   Depression screen [facility] 2/9 05/21/2021 11/08/2019 10/12/2019  Decreased Interest 1 0 3  Down, Depressed, Hopeless 0 1 3  PHQ - 2 Score 1 1 6   Altered sleeping - 3 1  Tired, decreased energy - 3 3  Change in appetite - 1 3  Feeling bad or failure about yourself  - 0 3  Trouble concentrating - 1 0  Moving slowly or fidgety/restless - 3 1  Suicidal thoughts - 0 0  PHQ-9  Score - 12 17  Difficult doing work/chores - Very difficult Somewhat difficult  Some recent data might be hidden   GAD 7 : Generalized Anxiety Score 11/08/2019 10/12/2019 04/11/2019 03/08/2019  Nervous, Anxious, on Edge 3 3 1 1   Control/stop worrying 3 3 1 1   Worry too much - different things 3 3 1 1   Trouble relaxing 3 3 1 2   Restless 3 1 1 2   Easily annoyed or irritable 3 3 1 2   Afraid - awful might happen 0 0 0 0  Total GAD 7 Score 18 16 6 9   Anxiety Difficulty Very difficult Somewhat difficult Somewhat difficult Somewhat difficult     ROS:  A comprehensive ROS was completed and negative except as noted per HPI   Allergies  Allergen Reactions   Hydroxyzine     "makes me feel weird"    Past Medical History:  Diagnosis Date   Alcohol abuse 04/15/2018   GAD (generalized anxiety disorder) 09/13/2017   MDD (major depressive disorder) 09/13/2017    No past surgical history on file.  Social History   Socioeconomic History   Marital status: Single    Spouse name: Not on file   Number of children: Not on file   Years of education: Not on file   Highest education level: Not on  file  Occupational History   Not on file  Tobacco Use   Smoking status: Every Day   Smokeless tobacco: Never  Substance and Sexual Activity   Alcohol use: No   Drug use: No   Sexual activity: Not on file  Other Topics Concern   Not on file  Social History Narrative   Not on file   Social Determinants of Health   Financial Resource Strain: Not on file  Food Insecurity: Not on file  Transportation Needs: Not on file  Physical Activity: Not on file  Stress: Not on file  Social Connections: Not on file    Family History  Problem Relation Age of Onset   Depression Brother     Health Maintenance  Topic Date Due   COVID-19 Vaccine (1) Never done   Pneumococcal Vaccine 51-26 Years old (1 - PCV) Never done   HPV VACCINES (1 - Male 2-dose series) Never done   HIV Screening  Never done    Hepatitis C Screening  Never done   TETANUS/TDAP  Never done   INFLUENZA VACCINE  05/12/2021     ----------------------------------------------------------------------------------------------------------------------------------------------------------------------------------------------------------------- Physical Exam BP 128/70 (BP Location: Left Arm, Patient Position: Sitting, Cuff Size: Normal)   Pulse 93   Temp 98.7 F (37.1 C)   Ht 6\' 1"  (1.854 m)   Wt 164 lb 6.4 oz (74.6 kg)   SpO2 97%   BMI 21.69 kg/m   Physical Exam Constitutional:      Appearance: Normal appearance.  Eyes:     General: No scleral icterus. Cardiovascular:     Rate and Rhythm: Normal rate and regular rhythm.  Pulmonary:     Effort: Pulmonary effort is normal.     Breath sounds: Normal breath sounds.  Musculoskeletal:     Cervical back: Neck supple.  Neurological:     General: No focal deficit present.     Mental Status: He is alert.  Psychiatric:        Mood and Affect: Mood normal.        Behavior: Behavior normal.    ------------------------------------------------------------------------------------------------------------------------------------------------------------------------------------------------------------------- Assessment and Plan  GAD (generalized anxiety disorder) Depressive symptoms have improved significantly however he continues to have quite a bit of anxiety and insomnia.  We will have him increase his Lexapro to 15 mg daily.  Increasing Lamictal to 100 mg twice per day.  Adding BuSpar as needed for breakthrough anxiety.  He is agreeable to seeing a therapist as long as we get him set up with a male therapist.  Referral placed.  Alcohol abuse Still has some occasional cravings but overall is doing well with naltrexone.  He will continue this for now.  Insomnia Insomnia related to anxiety, history of alcohol abuse likely contributing as well.  Adding trazodone at  bedtime.   Meds ordered this encounter  Medications   escitalopram (LEXAPRO) 10 MG tablet    Sig: Take 1.5 tablets (15 mg total) by mouth daily. Take 1/2 tablet for 7 days then increase to 1 full tablet.    Dispense:  135 tablet    Refill:  1   lamoTRIgine (LAMICTAL) 100 MG tablet    Sig: Take 1 tablet (100 mg total) by mouth 2 (two) times daily.    Dispense:  180 tablet    Refill:  1   traZODone (DESYREL) 50 MG tablet    Sig: Take 1-2 tablets (50-100 mg total) by mouth at bedtime.    Dispense:  90 tablet    Refill:  1  busPIRone (BUSPAR) 10 MG tablet    Sig: Take 1 tablet (10 mg total) by mouth 2 (two) times daily as needed.    Dispense:  180 tablet    Refill:  1    Return in about 5 weeks (around 06/25/2021) for Mood.    This visit occurred during the SARS-CoV-2 public health emergency.  Safety protocols were in place, including screening questions prior to the visit, additional usage of staff PPE, and extensive cleaning of exam room while observing appropriate contact time as indicated for disinfecting solutions.

## 2021-05-21 NOTE — Patient Instructions (Addendum)
Great to meet you today! Increase lexapro to 15mg  daily.  Increase lamictal to 100mg  twice daily.  Use trazodone 50-100mg  nightly as needed for sleep.  Use buspar 10mg  twice daily as needed for anxiety.  See me again in about 4-6 week.   Suicidal Feelings: How to Help Yourself Suicide is when you end your own life. There are many things you can do to help yourself feel better when struggling with these feelings. Many services and people are available to support you and others who struggle with similarfeelings.  If you ever feel like you may hurt yourself or others, or have thoughts about taking your own life, get help right away. To get help: Call your local emergency services (911 in the [place].). [facility]'s health and human services helpline (211 in the [place] Go to your nearest emergency department. Call a suicide hotline to speak with a trained counselor. The following suicide hotlines are available in the States: 1-800-273-TALK 516-789-8605). 1-800-SUICIDE 2760291090). 939-678-4294. This is a hotline for Spanish speakers. 6071242148. This is a hotline for TTY users. 1-866-4-U-TREVOR (626)769-7224). This is a hotline for lesbian, gay, bisexual, transgender, or questioning youth. For a list of hotlines in 1-517-616-0737, visit 1-062-694-8546.html Contact a crisis center or a local suicide prevention center. To find a crisis center or suicide prevention center: Call your local hospital, clinic, community service organization, mental health center, social service provider, or health department. Ask for help with connecting to a crisis center. For a list of crisis centers in the (2-703-500-9381, visit: suicidepreventionlifeline.org For a list of crisis centers in [place], visit: suicideprevention.ca How to help yourself feel better  Promise yourself that you will not do anything extreme when you have suicidal feelings. Remember  the times you have felt hopeful. Many people have gotten through suicidal thoughts and feelings, and you can too. If you have had these feelings before, remind yourself that you can get through them again. Let family, friends, teachers, or counselors know how you are feeling. Try not to separate yourself from those who care about you and want to help you. Talk with someone every day, even if you do not feel sociable. Face-to-face conversation is best to help them understand your feelings. Contact a mental health care provider and work with this person regularly. Make a safety plan that you can follow during a crisis. Include phone numbers of suicide prevention hotlines, mental health professionals, and trusted friends and family members you can call during an emergency. Save these numbers on your phone. If you are thinking of taking a lot of medicine, give your medicine to someone who can give it to you as prescribed. If you are on antidepressants and are concerned you will overdose, tell your health care provider so that he or she can give you safer medicines. Try to stick to your routines and follow a schedule every day. Make self-care a priority. Make a list of realistic goals, and cross them off when you achieve them. Accomplishments can give you a sense of worth. Wait until you are feeling better before doing things that you find difficult or unpleasant. Do things that you have always enjoyed to take your mind off your feelings. Try reading a book, or listening to or playing music. Spending time outside, in nature, may help you feel better. Follow these instructions at home:  Visit your primary health care provider every year for a checkup. Work with a mental health care provider as needed. Eat a well-balanced diet,  and eat regular meals. Get plenty of rest. Exercise if you are able. Just 30 minutes of exercise each day can help you feel better. Take over-the-counter and prescription medicines  only as told by your health care provider. Ask your mental health care provider about the possible side effects of any medicines you are taking. Do not use alcohol or drugs, and remove these substances from your home. Remove weapons, poisons, knives, and other deadly items from your home. General recommendations Keep your living space well lit. When you are feeling well, write yourself a letter with tips and support that you can read when you are not feeling well. Remember that life's difficulties can be sorted out with help. Conditions can be treated, and you can learn behaviors and ways of thinking that will help you. Where to find more information National Suicide Prevention Lifeline: www.suicidepreventionlifeline.org Hopeline: www.hopeline.com [employer] for Suicide Prevention: https://www.ayers.com/ [employer] (for lesbian, gay, bisexual, transgender, or questioning youth): www.thetrevorproject.[employer] of Mental Health: http://www.wall.info/ Contact a health care provider if: You feel as though you are a burden to others. You feel agitated, angry, vengeful, or have extreme mood swings. You have withdrawn from family and friends. Get help right away if: You are talking about suicide or wishing to die. You start making plans for how to commit suicide. You feel that you have no reason to live. You start making plans for putting your affairs in order, saying goodbye, or giving your possessions away. You feel guilt, shame, or unbearable pain, and it seems like there is no way out. You are frequently using drugs or alcohol. You are engaging in risky behaviors that could lead to death. If you have any of these symptoms, get help right away. Call emergency services, go to your nearest emergency department or crisis center, or call a suicide crisis helpline. Summary Suicide is when you take your own life. Promise yourself that you will  not do anything extreme when you have suicidal feelings. Let family, friends, teachers, or counselors know how you are feeling. Get help right away if you start making plans for how to commit suicide. This information is not intended to replace advice given to you by your health care provider. Make sure you discuss any questions you have with your healthcare provider. Document Revised: 06/12/2020 Document Reviewed: 06/14/2020 Elsevier Patient Education  2022 [employer].

## 2021-05-30 ENCOUNTER — Encounter: Payer: Self-pay | Admitting: Family Medicine

## 2021-06-06 DIAGNOSIS — M5387 Other specified dorsopathies, lumbosacral region: Secondary | ICD-10-CM | POA: Diagnosis not present

## 2021-06-06 DIAGNOSIS — M9902 Segmental and somatic dysfunction of thoracic region: Secondary | ICD-10-CM | POA: Diagnosis not present

## 2021-06-06 DIAGNOSIS — M5388 Other specified dorsopathies, sacral and sacrococcygeal region: Secondary | ICD-10-CM | POA: Diagnosis not present

## 2021-06-06 DIAGNOSIS — M4608 Spinal enthesopathy, sacral and sacrococcygeal region: Secondary | ICD-10-CM | POA: Diagnosis not present

## 2021-06-06 DIAGNOSIS — F17200 Nicotine dependence, unspecified, uncomplicated: Secondary | ICD-10-CM | POA: Diagnosis not present

## 2021-06-06 DIAGNOSIS — M4604 Spinal enthesopathy, thoracic region: Secondary | ICD-10-CM | POA: Diagnosis not present

## 2021-06-06 DIAGNOSIS — M4607 Spinal enthesopathy, lumbosacral region: Secondary | ICD-10-CM | POA: Diagnosis not present

## 2021-06-06 DIAGNOSIS — F1020 Alcohol dependence, uncomplicated: Secondary | ICD-10-CM | POA: Diagnosis not present

## 2021-06-06 DIAGNOSIS — G44209 Tension-type headache, unspecified, not intractable: Secondary | ICD-10-CM | POA: Diagnosis not present

## 2021-06-06 DIAGNOSIS — M4602 Spinal enthesopathy, cervical region: Secondary | ICD-10-CM | POA: Diagnosis not present

## 2021-06-09 ENCOUNTER — Inpatient Hospital Stay: Payer: Self-pay | Admitting: Physician Assistant

## 2021-06-10 DIAGNOSIS — M4602 Spinal enthesopathy, cervical region: Secondary | ICD-10-CM | POA: Diagnosis not present

## 2021-06-10 DIAGNOSIS — M4607 Spinal enthesopathy, lumbosacral region: Secondary | ICD-10-CM | POA: Diagnosis not present

## 2021-06-10 DIAGNOSIS — M4608 Spinal enthesopathy, sacral and sacrococcygeal region: Secondary | ICD-10-CM | POA: Diagnosis not present

## 2021-06-10 DIAGNOSIS — M5388 Other specified dorsopathies, sacral and sacrococcygeal region: Secondary | ICD-10-CM | POA: Diagnosis not present

## 2021-06-10 DIAGNOSIS — M5387 Other specified dorsopathies, lumbosacral region: Secondary | ICD-10-CM | POA: Diagnosis not present

## 2021-06-10 DIAGNOSIS — M9902 Segmental and somatic dysfunction of thoracic region: Secondary | ICD-10-CM | POA: Diagnosis not present

## 2021-06-10 DIAGNOSIS — M4604 Spinal enthesopathy, thoracic region: Secondary | ICD-10-CM | POA: Diagnosis not present

## 2021-06-11 DIAGNOSIS — M4607 Spinal enthesopathy, lumbosacral region: Secondary | ICD-10-CM | POA: Diagnosis not present

## 2021-06-11 DIAGNOSIS — M5388 Other specified dorsopathies, sacral and sacrococcygeal region: Secondary | ICD-10-CM | POA: Diagnosis not present

## 2021-06-11 DIAGNOSIS — M4602 Spinal enthesopathy, cervical region: Secondary | ICD-10-CM | POA: Diagnosis not present

## 2021-06-11 DIAGNOSIS — M9902 Segmental and somatic dysfunction of thoracic region: Secondary | ICD-10-CM | POA: Diagnosis not present

## 2021-06-11 DIAGNOSIS — G44209 Tension-type headache, unspecified, not intractable: Secondary | ICD-10-CM | POA: Diagnosis not present

## 2021-06-11 DIAGNOSIS — M4604 Spinal enthesopathy, thoracic region: Secondary | ICD-10-CM | POA: Diagnosis not present

## 2021-06-11 DIAGNOSIS — M5387 Other specified dorsopathies, lumbosacral region: Secondary | ICD-10-CM | POA: Diagnosis not present

## 2021-06-11 DIAGNOSIS — M4608 Spinal enthesopathy, sacral and sacrococcygeal region: Secondary | ICD-10-CM | POA: Diagnosis not present

## 2021-06-12 DIAGNOSIS — M4608 Spinal enthesopathy, sacral and sacrococcygeal region: Secondary | ICD-10-CM | POA: Diagnosis not present

## 2021-06-12 DIAGNOSIS — M4602 Spinal enthesopathy, cervical region: Secondary | ICD-10-CM | POA: Diagnosis not present

## 2021-06-12 DIAGNOSIS — G44209 Tension-type headache, unspecified, not intractable: Secondary | ICD-10-CM | POA: Diagnosis not present

## 2021-06-12 DIAGNOSIS — M4604 Spinal enthesopathy, thoracic region: Secondary | ICD-10-CM | POA: Diagnosis not present

## 2021-06-12 DIAGNOSIS — M9902 Segmental and somatic dysfunction of thoracic region: Secondary | ICD-10-CM | POA: Diagnosis not present

## 2021-06-12 DIAGNOSIS — M5387 Other specified dorsopathies, lumbosacral region: Secondary | ICD-10-CM | POA: Diagnosis not present

## 2021-06-12 DIAGNOSIS — M5388 Other specified dorsopathies, sacral and sacrococcygeal region: Secondary | ICD-10-CM | POA: Diagnosis not present

## 2021-06-15 DIAGNOSIS — M5387 Other specified dorsopathies, lumbosacral region: Secondary | ICD-10-CM | POA: Diagnosis not present

## 2021-06-15 DIAGNOSIS — M62838 Other muscle spasm: Secondary | ICD-10-CM | POA: Diagnosis not present

## 2021-06-15 DIAGNOSIS — M542 Cervicalgia: Secondary | ICD-10-CM | POA: Diagnosis not present

## 2021-06-15 DIAGNOSIS — G44209 Tension-type headache, unspecified, not intractable: Secondary | ICD-10-CM | POA: Diagnosis not present

## 2021-06-15 DIAGNOSIS — F17200 Nicotine dependence, unspecified, uncomplicated: Secondary | ICD-10-CM | POA: Diagnosis not present

## 2021-06-15 DIAGNOSIS — F1020 Alcohol dependence, uncomplicated: Secondary | ICD-10-CM | POA: Diagnosis not present

## 2021-06-15 DIAGNOSIS — M62830 Muscle spasm of back: Secondary | ICD-10-CM | POA: Diagnosis not present

## 2021-06-15 DIAGNOSIS — S46012A Strain of muscle(s) and tendon(s) of the rotator cuff of left shoulder, initial encounter: Secondary | ICD-10-CM | POA: Diagnosis not present

## 2021-06-15 DIAGNOSIS — M9902 Segmental and somatic dysfunction of thoracic region: Secondary | ICD-10-CM | POA: Diagnosis not present

## 2021-06-15 DIAGNOSIS — S46011A Strain of muscle(s) and tendon(s) of the rotator cuff of right shoulder, initial encounter: Secondary | ICD-10-CM | POA: Diagnosis not present

## 2021-06-18 DIAGNOSIS — S46011A Strain of muscle(s) and tendon(s) of the rotator cuff of right shoulder, initial encounter: Secondary | ICD-10-CM | POA: Diagnosis not present

## 2021-06-18 DIAGNOSIS — M9902 Segmental and somatic dysfunction of thoracic region: Secondary | ICD-10-CM | POA: Diagnosis not present

## 2021-06-18 DIAGNOSIS — M5387 Other specified dorsopathies, lumbosacral region: Secondary | ICD-10-CM | POA: Diagnosis not present

## 2021-06-18 DIAGNOSIS — M62830 Muscle spasm of back: Secondary | ICD-10-CM | POA: Diagnosis not present

## 2021-06-18 DIAGNOSIS — S46012A Strain of muscle(s) and tendon(s) of the rotator cuff of left shoulder, initial encounter: Secondary | ICD-10-CM | POA: Diagnosis not present

## 2021-06-18 DIAGNOSIS — M542 Cervicalgia: Secondary | ICD-10-CM | POA: Diagnosis not present

## 2021-06-18 DIAGNOSIS — G44209 Tension-type headache, unspecified, not intractable: Secondary | ICD-10-CM | POA: Diagnosis not present

## 2021-06-19 DIAGNOSIS — M9902 Segmental and somatic dysfunction of thoracic region: Secondary | ICD-10-CM | POA: Diagnosis not present

## 2021-06-19 DIAGNOSIS — M62830 Muscle spasm of back: Secondary | ICD-10-CM | POA: Diagnosis not present

## 2021-06-19 DIAGNOSIS — M542 Cervicalgia: Secondary | ICD-10-CM | POA: Diagnosis not present

## 2021-06-19 DIAGNOSIS — M62838 Other muscle spasm: Secondary | ICD-10-CM | POA: Diagnosis not present

## 2021-06-19 DIAGNOSIS — G44209 Tension-type headache, unspecified, not intractable: Secondary | ICD-10-CM | POA: Diagnosis not present

## 2021-06-19 DIAGNOSIS — M5387 Other specified dorsopathies, lumbosacral region: Secondary | ICD-10-CM | POA: Diagnosis not present

## 2021-06-19 DIAGNOSIS — S46011A Strain of muscle(s) and tendon(s) of the rotator cuff of right shoulder, initial encounter: Secondary | ICD-10-CM | POA: Diagnosis not present

## 2021-06-19 DIAGNOSIS — S46012A Strain of muscle(s) and tendon(s) of the rotator cuff of left shoulder, initial encounter: Secondary | ICD-10-CM | POA: Diagnosis not present

## 2021-06-20 DIAGNOSIS — S46011A Strain of muscle(s) and tendon(s) of the rotator cuff of right shoulder, initial encounter: Secondary | ICD-10-CM | POA: Diagnosis not present

## 2021-06-20 DIAGNOSIS — M5387 Other specified dorsopathies, lumbosacral region: Secondary | ICD-10-CM | POA: Diagnosis not present

## 2021-06-20 DIAGNOSIS — M9902 Segmental and somatic dysfunction of thoracic region: Secondary | ICD-10-CM | POA: Diagnosis not present

## 2021-06-20 DIAGNOSIS — G44209 Tension-type headache, unspecified, not intractable: Secondary | ICD-10-CM | POA: Diagnosis not present

## 2021-06-20 DIAGNOSIS — M542 Cervicalgia: Secondary | ICD-10-CM | POA: Diagnosis not present

## 2021-06-20 DIAGNOSIS — M62830 Muscle spasm of back: Secondary | ICD-10-CM | POA: Diagnosis not present

## 2021-06-20 DIAGNOSIS — M62838 Other muscle spasm: Secondary | ICD-10-CM | POA: Diagnosis not present

## 2021-06-23 DIAGNOSIS — M5387 Other specified dorsopathies, lumbosacral region: Secondary | ICD-10-CM | POA: Diagnosis not present

## 2021-06-23 DIAGNOSIS — G44209 Tension-type headache, unspecified, not intractable: Secondary | ICD-10-CM | POA: Diagnosis not present

## 2021-06-23 DIAGNOSIS — M9902 Segmental and somatic dysfunction of thoracic region: Secondary | ICD-10-CM | POA: Diagnosis not present

## 2021-06-23 DIAGNOSIS — S46011A Strain of muscle(s) and tendon(s) of the rotator cuff of right shoulder, initial encounter: Secondary | ICD-10-CM | POA: Diagnosis not present

## 2021-06-23 DIAGNOSIS — S46012A Strain of muscle(s) and tendon(s) of the rotator cuff of left shoulder, initial encounter: Secondary | ICD-10-CM | POA: Diagnosis not present

## 2021-06-23 DIAGNOSIS — M542 Cervicalgia: Secondary | ICD-10-CM | POA: Diagnosis not present

## 2021-06-24 ENCOUNTER — Other Ambulatory Visit: Payer: Self-pay | Admitting: Family Medicine

## 2021-06-24 DIAGNOSIS — S46011A Strain of muscle(s) and tendon(s) of the rotator cuff of right shoulder, initial encounter: Secondary | ICD-10-CM | POA: Diagnosis not present

## 2021-06-24 DIAGNOSIS — F39 Unspecified mood [affective] disorder: Secondary | ICD-10-CM

## 2021-06-24 DIAGNOSIS — M542 Cervicalgia: Secondary | ICD-10-CM | POA: Diagnosis not present

## 2021-06-24 DIAGNOSIS — M62830 Muscle spasm of back: Secondary | ICD-10-CM | POA: Diagnosis not present

## 2021-06-24 DIAGNOSIS — M9902 Segmental and somatic dysfunction of thoracic region: Secondary | ICD-10-CM | POA: Diagnosis not present

## 2021-06-24 DIAGNOSIS — S46012A Strain of muscle(s) and tendon(s) of the rotator cuff of left shoulder, initial encounter: Secondary | ICD-10-CM | POA: Diagnosis not present

## 2021-06-24 DIAGNOSIS — M5387 Other specified dorsopathies, lumbosacral region: Secondary | ICD-10-CM | POA: Diagnosis not present

## 2021-06-24 DIAGNOSIS — G44209 Tension-type headache, unspecified, not intractable: Secondary | ICD-10-CM | POA: Diagnosis not present

## 2021-06-24 DIAGNOSIS — F321 Major depressive disorder, single episode, moderate: Secondary | ICD-10-CM

## 2021-06-24 NOTE — Telephone Encounter (Signed)
Pt re-established with you.

## 2021-06-25 DIAGNOSIS — S46011A Strain of muscle(s) and tendon(s) of the rotator cuff of right shoulder, initial encounter: Secondary | ICD-10-CM | POA: Diagnosis not present

## 2021-06-25 DIAGNOSIS — M5387 Other specified dorsopathies, lumbosacral region: Secondary | ICD-10-CM | POA: Diagnosis not present

## 2021-06-25 DIAGNOSIS — M542 Cervicalgia: Secondary | ICD-10-CM | POA: Diagnosis not present

## 2021-06-25 DIAGNOSIS — M62838 Other muscle spasm: Secondary | ICD-10-CM | POA: Diagnosis not present

## 2021-06-25 DIAGNOSIS — M9902 Segmental and somatic dysfunction of thoracic region: Secondary | ICD-10-CM | POA: Diagnosis not present

## 2021-06-25 DIAGNOSIS — G44209 Tension-type headache, unspecified, not intractable: Secondary | ICD-10-CM | POA: Diagnosis not present

## 2021-06-25 DIAGNOSIS — S46012A Strain of muscle(s) and tendon(s) of the rotator cuff of left shoulder, initial encounter: Secondary | ICD-10-CM | POA: Diagnosis not present

## 2021-06-26 DIAGNOSIS — M62838 Other muscle spasm: Secondary | ICD-10-CM | POA: Diagnosis not present

## 2021-06-26 DIAGNOSIS — M9902 Segmental and somatic dysfunction of thoracic region: Secondary | ICD-10-CM | POA: Diagnosis not present

## 2021-06-26 DIAGNOSIS — S46011A Strain of muscle(s) and tendon(s) of the rotator cuff of right shoulder, initial encounter: Secondary | ICD-10-CM | POA: Diagnosis not present

## 2021-06-26 DIAGNOSIS — M542 Cervicalgia: Secondary | ICD-10-CM | POA: Diagnosis not present

## 2021-06-26 DIAGNOSIS — S46012A Strain of muscle(s) and tendon(s) of the rotator cuff of left shoulder, initial encounter: Secondary | ICD-10-CM | POA: Diagnosis not present

## 2021-06-26 DIAGNOSIS — M5387 Other specified dorsopathies, lumbosacral region: Secondary | ICD-10-CM | POA: Diagnosis not present

## 2021-06-27 ENCOUNTER — Other Ambulatory Visit: Payer: Self-pay | Admitting: Family Medicine

## 2021-06-27 DIAGNOSIS — M62830 Muscle spasm of back: Secondary | ICD-10-CM | POA: Diagnosis not present

## 2021-06-27 DIAGNOSIS — S46011A Strain of muscle(s) and tendon(s) of the rotator cuff of right shoulder, initial encounter: Secondary | ICD-10-CM | POA: Diagnosis not present

## 2021-06-27 DIAGNOSIS — M542 Cervicalgia: Secondary | ICD-10-CM | POA: Diagnosis not present

## 2021-06-27 DIAGNOSIS — M5387 Other specified dorsopathies, lumbosacral region: Secondary | ICD-10-CM | POA: Diagnosis not present

## 2021-06-27 DIAGNOSIS — M9902 Segmental and somatic dysfunction of thoracic region: Secondary | ICD-10-CM | POA: Diagnosis not present

## 2021-06-30 ENCOUNTER — Ambulatory Visit: Payer: BC Managed Care – PPO | Admitting: Professional

## 2021-06-30 DIAGNOSIS — M542 Cervicalgia: Secondary | ICD-10-CM | POA: Diagnosis not present

## 2021-06-30 DIAGNOSIS — S46012A Strain of muscle(s) and tendon(s) of the rotator cuff of left shoulder, initial encounter: Secondary | ICD-10-CM | POA: Diagnosis not present

## 2021-06-30 DIAGNOSIS — S46011A Strain of muscle(s) and tendon(s) of the rotator cuff of right shoulder, initial encounter: Secondary | ICD-10-CM | POA: Diagnosis not present

## 2021-06-30 DIAGNOSIS — M5387 Other specified dorsopathies, lumbosacral region: Secondary | ICD-10-CM | POA: Diagnosis not present

## 2021-06-30 DIAGNOSIS — E860 Dehydration: Secondary | ICD-10-CM | POA: Diagnosis not present

## 2021-06-30 DIAGNOSIS — M9902 Segmental and somatic dysfunction of thoracic region: Secondary | ICD-10-CM | POA: Diagnosis not present

## 2021-06-30 DIAGNOSIS — M62830 Muscle spasm of back: Secondary | ICD-10-CM | POA: Diagnosis not present

## 2021-06-30 DIAGNOSIS — Z724 Inappropriate diet and eating habits: Secondary | ICD-10-CM | POA: Diagnosis not present

## 2021-06-30 DIAGNOSIS — R635 Abnormal weight gain: Secondary | ICD-10-CM | POA: Diagnosis not present

## 2021-06-30 DIAGNOSIS — G44209 Tension-type headache, unspecified, not intractable: Secondary | ICD-10-CM | POA: Diagnosis not present

## 2021-07-01 DIAGNOSIS — M542 Cervicalgia: Secondary | ICD-10-CM | POA: Diagnosis not present

## 2021-07-01 DIAGNOSIS — M62838 Other muscle spasm: Secondary | ICD-10-CM | POA: Diagnosis not present

## 2021-07-01 DIAGNOSIS — M62830 Muscle spasm of back: Secondary | ICD-10-CM | POA: Diagnosis not present

## 2021-07-01 DIAGNOSIS — M9902 Segmental and somatic dysfunction of thoracic region: Secondary | ICD-10-CM | POA: Diagnosis not present

## 2021-07-01 DIAGNOSIS — M5387 Other specified dorsopathies, lumbosacral region: Secondary | ICD-10-CM | POA: Diagnosis not present

## 2021-07-01 DIAGNOSIS — S46011A Strain of muscle(s) and tendon(s) of the rotator cuff of right shoulder, initial encounter: Secondary | ICD-10-CM | POA: Diagnosis not present

## 2021-07-01 DIAGNOSIS — G44209 Tension-type headache, unspecified, not intractable: Secondary | ICD-10-CM | POA: Diagnosis not present

## 2021-07-01 DIAGNOSIS — S46012A Strain of muscle(s) and tendon(s) of the rotator cuff of left shoulder, initial encounter: Secondary | ICD-10-CM | POA: Diagnosis not present

## 2021-07-02 DIAGNOSIS — M62838 Other muscle spasm: Secondary | ICD-10-CM | POA: Diagnosis not present

## 2021-07-02 DIAGNOSIS — M5387 Other specified dorsopathies, lumbosacral region: Secondary | ICD-10-CM | POA: Diagnosis not present

## 2021-07-02 DIAGNOSIS — M542 Cervicalgia: Secondary | ICD-10-CM | POA: Diagnosis not present

## 2021-07-02 DIAGNOSIS — S46011A Strain of muscle(s) and tendon(s) of the rotator cuff of right shoulder, initial encounter: Secondary | ICD-10-CM | POA: Diagnosis not present

## 2021-07-02 DIAGNOSIS — M9902 Segmental and somatic dysfunction of thoracic region: Secondary | ICD-10-CM | POA: Diagnosis not present

## 2021-07-02 DIAGNOSIS — G44209 Tension-type headache, unspecified, not intractable: Secondary | ICD-10-CM | POA: Diagnosis not present

## 2021-07-02 DIAGNOSIS — M62830 Muscle spasm of back: Secondary | ICD-10-CM | POA: Diagnosis not present

## 2021-07-03 DIAGNOSIS — S46012A Strain of muscle(s) and tendon(s) of the rotator cuff of left shoulder, initial encounter: Secondary | ICD-10-CM | POA: Diagnosis not present

## 2021-07-03 DIAGNOSIS — M62838 Other muscle spasm: Secondary | ICD-10-CM | POA: Diagnosis not present

## 2021-07-03 DIAGNOSIS — F1020 Alcohol dependence, uncomplicated: Secondary | ICD-10-CM | POA: Diagnosis not present

## 2021-07-03 DIAGNOSIS — S46011A Strain of muscle(s) and tendon(s) of the rotator cuff of right shoulder, initial encounter: Secondary | ICD-10-CM | POA: Diagnosis not present

## 2021-07-03 DIAGNOSIS — M542 Cervicalgia: Secondary | ICD-10-CM | POA: Diagnosis not present

## 2021-07-03 DIAGNOSIS — M9902 Segmental and somatic dysfunction of thoracic region: Secondary | ICD-10-CM | POA: Diagnosis not present

## 2021-07-03 DIAGNOSIS — M5387 Other specified dorsopathies, lumbosacral region: Secondary | ICD-10-CM | POA: Diagnosis not present

## 2021-07-04 DIAGNOSIS — F1020 Alcohol dependence, uncomplicated: Secondary | ICD-10-CM | POA: Diagnosis not present

## 2021-07-06 DIAGNOSIS — F1020 Alcohol dependence, uncomplicated: Secondary | ICD-10-CM | POA: Diagnosis not present

## 2021-07-07 DIAGNOSIS — F1020 Alcohol dependence, uncomplicated: Secondary | ICD-10-CM | POA: Diagnosis not present

## 2021-07-08 DIAGNOSIS — F1020 Alcohol dependence, uncomplicated: Secondary | ICD-10-CM | POA: Diagnosis not present

## 2021-07-09 DIAGNOSIS — F1020 Alcohol dependence, uncomplicated: Secondary | ICD-10-CM | POA: Diagnosis not present

## 2021-07-10 DIAGNOSIS — F1020 Alcohol dependence, uncomplicated: Secondary | ICD-10-CM | POA: Diagnosis not present

## 2021-07-11 DIAGNOSIS — F1020 Alcohol dependence, uncomplicated: Secondary | ICD-10-CM | POA: Diagnosis not present

## 2021-07-14 DIAGNOSIS — F1020 Alcohol dependence, uncomplicated: Secondary | ICD-10-CM | POA: Diagnosis not present

## 2021-07-15 DIAGNOSIS — F411 Generalized anxiety disorder: Secondary | ICD-10-CM | POA: Diagnosis not present

## 2021-07-15 DIAGNOSIS — F1020 Alcohol dependence, uncomplicated: Secondary | ICD-10-CM | POA: Diagnosis not present

## 2021-07-15 DIAGNOSIS — F3130 Bipolar disorder, current episode depressed, mild or moderate severity, unspecified: Secondary | ICD-10-CM | POA: Diagnosis not present

## 2021-07-16 DIAGNOSIS — F1020 Alcohol dependence, uncomplicated: Secondary | ICD-10-CM | POA: Diagnosis not present

## 2021-07-17 DIAGNOSIS — F1020 Alcohol dependence, uncomplicated: Secondary | ICD-10-CM | POA: Diagnosis not present

## 2021-07-18 DIAGNOSIS — F1020 Alcohol dependence, uncomplicated: Secondary | ICD-10-CM | POA: Diagnosis not present

## 2021-07-19 DIAGNOSIS — F1020 Alcohol dependence, uncomplicated: Secondary | ICD-10-CM | POA: Diagnosis not present

## 2021-07-20 DIAGNOSIS — F1020 Alcohol dependence, uncomplicated: Secondary | ICD-10-CM | POA: Diagnosis not present

## 2021-07-21 DIAGNOSIS — F1020 Alcohol dependence, uncomplicated: Secondary | ICD-10-CM | POA: Diagnosis not present

## 2021-07-22 DIAGNOSIS — F1020 Alcohol dependence, uncomplicated: Secondary | ICD-10-CM | POA: Diagnosis not present

## 2021-07-23 DIAGNOSIS — F1020 Alcohol dependence, uncomplicated: Secondary | ICD-10-CM | POA: Diagnosis not present

## 2021-07-24 DIAGNOSIS — F1020 Alcohol dependence, uncomplicated: Secondary | ICD-10-CM | POA: Diagnosis not present

## 2021-07-25 DIAGNOSIS — F1020 Alcohol dependence, uncomplicated: Secondary | ICD-10-CM | POA: Diagnosis not present

## 2021-07-28 DIAGNOSIS — F1020 Alcohol dependence, uncomplicated: Secondary | ICD-10-CM | POA: Diagnosis not present

## 2021-07-29 DIAGNOSIS — F1020 Alcohol dependence, uncomplicated: Secondary | ICD-10-CM | POA: Diagnosis not present

## 2021-07-30 DIAGNOSIS — F1020 Alcohol dependence, uncomplicated: Secondary | ICD-10-CM | POA: Diagnosis not present

## 2021-07-31 DIAGNOSIS — F1020 Alcohol dependence, uncomplicated: Secondary | ICD-10-CM | POA: Diagnosis not present

## 2021-08-01 DIAGNOSIS — F1020 Alcohol dependence, uncomplicated: Secondary | ICD-10-CM | POA: Diagnosis not present

## 2021-08-04 DIAGNOSIS — F1020 Alcohol dependence, uncomplicated: Secondary | ICD-10-CM | POA: Diagnosis not present

## 2021-08-05 DIAGNOSIS — F1020 Alcohol dependence, uncomplicated: Secondary | ICD-10-CM | POA: Diagnosis not present

## 2021-08-06 DIAGNOSIS — F1020 Alcohol dependence, uncomplicated: Secondary | ICD-10-CM | POA: Diagnosis not present

## 2021-08-07 DIAGNOSIS — F1020 Alcohol dependence, uncomplicated: Secondary | ICD-10-CM | POA: Diagnosis not present

## 2021-08-08 DIAGNOSIS — F1020 Alcohol dependence, uncomplicated: Secondary | ICD-10-CM | POA: Diagnosis not present

## 2021-08-11 DIAGNOSIS — F1020 Alcohol dependence, uncomplicated: Secondary | ICD-10-CM | POA: Diagnosis not present

## 2021-08-12 DIAGNOSIS — F1020 Alcohol dependence, uncomplicated: Secondary | ICD-10-CM | POA: Diagnosis not present

## 2021-08-13 DIAGNOSIS — F1020 Alcohol dependence, uncomplicated: Secondary | ICD-10-CM | POA: Diagnosis not present

## 2021-08-14 DIAGNOSIS — F1020 Alcohol dependence, uncomplicated: Secondary | ICD-10-CM | POA: Diagnosis not present

## 2021-08-15 DIAGNOSIS — F1020 Alcohol dependence, uncomplicated: Secondary | ICD-10-CM | POA: Diagnosis not present

## 2021-08-18 DIAGNOSIS — F1020 Alcohol dependence, uncomplicated: Secondary | ICD-10-CM | POA: Diagnosis not present

## 2021-08-19 DIAGNOSIS — F1020 Alcohol dependence, uncomplicated: Secondary | ICD-10-CM | POA: Diagnosis not present

## 2021-08-20 DIAGNOSIS — F1020 Alcohol dependence, uncomplicated: Secondary | ICD-10-CM | POA: Diagnosis not present

## 2021-08-21 DIAGNOSIS — F1020 Alcohol dependence, uncomplicated: Secondary | ICD-10-CM | POA: Diagnosis not present

## 2021-08-22 DIAGNOSIS — F1020 Alcohol dependence, uncomplicated: Secondary | ICD-10-CM | POA: Diagnosis not present

## 2021-08-23 ENCOUNTER — Other Ambulatory Visit: Payer: Self-pay | Admitting: Family Medicine

## 2021-08-25 DIAGNOSIS — F1020 Alcohol dependence, uncomplicated: Secondary | ICD-10-CM | POA: Diagnosis not present

## 2021-08-25 NOTE — Telephone Encounter (Signed)
This patient was supposed to switch over to Dr. Judie Petit. Did that not happen?

## 2021-08-26 DIAGNOSIS — F1020 Alcohol dependence, uncomplicated: Secondary | ICD-10-CM | POA: Diagnosis not present

## 2021-08-26 NOTE — Telephone Encounter (Signed)
PCP updated.

## 2021-08-27 DIAGNOSIS — F1020 Alcohol dependence, uncomplicated: Secondary | ICD-10-CM | POA: Diagnosis not present

## 2021-08-28 DIAGNOSIS — F1020 Alcohol dependence, uncomplicated: Secondary | ICD-10-CM | POA: Diagnosis not present

## 2021-08-29 DIAGNOSIS — F1020 Alcohol dependence, uncomplicated: Secondary | ICD-10-CM | POA: Diagnosis not present

## 2021-08-30 DIAGNOSIS — F1020 Alcohol dependence, uncomplicated: Secondary | ICD-10-CM | POA: Diagnosis not present

## 2021-08-31 DIAGNOSIS — F1020 Alcohol dependence, uncomplicated: Secondary | ICD-10-CM | POA: Diagnosis not present

## 2021-09-01 DIAGNOSIS — F1020 Alcohol dependence, uncomplicated: Secondary | ICD-10-CM | POA: Diagnosis not present

## 2021-09-02 DIAGNOSIS — F1020 Alcohol dependence, uncomplicated: Secondary | ICD-10-CM | POA: Diagnosis not present

## 2021-09-03 DIAGNOSIS — F1020 Alcohol dependence, uncomplicated: Secondary | ICD-10-CM | POA: Diagnosis not present

## 2021-09-04 DIAGNOSIS — F1020 Alcohol dependence, uncomplicated: Secondary | ICD-10-CM | POA: Diagnosis not present

## 2021-09-08 DIAGNOSIS — F1020 Alcohol dependence, uncomplicated: Secondary | ICD-10-CM | POA: Diagnosis not present

## 2021-09-09 ENCOUNTER — Other Ambulatory Visit: Payer: Self-pay | Admitting: Family Medicine

## 2021-12-14 DIAGNOSIS — F319 Bipolar disorder, unspecified: Secondary | ICD-10-CM | POA: Diagnosis not present

## 2021-12-14 DIAGNOSIS — F1020 Alcohol dependence, uncomplicated: Secondary | ICD-10-CM | POA: Diagnosis not present

## 2021-12-14 DIAGNOSIS — J45998 Other asthma: Secondary | ICD-10-CM | POA: Diagnosis not present

## 2021-12-19 DIAGNOSIS — J45998 Other asthma: Secondary | ICD-10-CM | POA: Diagnosis not present

## 2021-12-19 DIAGNOSIS — F319 Bipolar disorder, unspecified: Secondary | ICD-10-CM | POA: Diagnosis not present

## 2021-12-19 DIAGNOSIS — F1020 Alcohol dependence, uncomplicated: Secondary | ICD-10-CM | POA: Diagnosis not present

## 2022-01-02 DIAGNOSIS — F319 Bipolar disorder, unspecified: Secondary | ICD-10-CM | POA: Diagnosis not present

## 2022-01-02 DIAGNOSIS — F1020 Alcohol dependence, uncomplicated: Secondary | ICD-10-CM | POA: Diagnosis not present

## 2022-01-02 DIAGNOSIS — J45998 Other asthma: Secondary | ICD-10-CM | POA: Diagnosis not present

## 2022-01-03 DIAGNOSIS — F1020 Alcohol dependence, uncomplicated: Secondary | ICD-10-CM | POA: Diagnosis not present

## 2022-01-03 DIAGNOSIS — F319 Bipolar disorder, unspecified: Secondary | ICD-10-CM | POA: Diagnosis not present

## 2022-01-03 DIAGNOSIS — J45998 Other asthma: Secondary | ICD-10-CM | POA: Diagnosis not present

## 2022-01-04 ENCOUNTER — Other Ambulatory Visit: Payer: Self-pay | Admitting: Family Medicine

## 2022-01-04 DIAGNOSIS — F1020 Alcohol dependence, uncomplicated: Secondary | ICD-10-CM | POA: Diagnosis not present

## 2022-01-04 DIAGNOSIS — J45998 Other asthma: Secondary | ICD-10-CM | POA: Diagnosis not present

## 2022-01-04 DIAGNOSIS — F319 Bipolar disorder, unspecified: Secondary | ICD-10-CM | POA: Diagnosis not present

## 2022-01-05 DIAGNOSIS — F319 Bipolar disorder, unspecified: Secondary | ICD-10-CM | POA: Diagnosis not present

## 2022-01-05 DIAGNOSIS — F1020 Alcohol dependence, uncomplicated: Secondary | ICD-10-CM | POA: Diagnosis not present

## 2022-01-05 DIAGNOSIS — J45998 Other asthma: Secondary | ICD-10-CM | POA: Diagnosis not present

## 2022-01-05 NOTE — Telephone Encounter (Signed)
Please call pt to schedule appt. Sending 30 day med refill.  ?Return in about 5 weeks (around 06/25/2021) for Mood (past due) ? ?Thanks ?

## 2022-01-06 ENCOUNTER — Other Ambulatory Visit: Payer: Self-pay | Admitting: Family Medicine

## 2022-01-06 DIAGNOSIS — J45998 Other asthma: Secondary | ICD-10-CM | POA: Diagnosis not present

## 2022-01-06 DIAGNOSIS — F319 Bipolar disorder, unspecified: Secondary | ICD-10-CM | POA: Diagnosis not present

## 2022-01-06 DIAGNOSIS — F1020 Alcohol dependence, uncomplicated: Secondary | ICD-10-CM | POA: Diagnosis not present

## 2022-01-06 NOTE — Telephone Encounter (Signed)
Attempted patient again, phone still is off & goes straight to voicemail & VM box full. ?

## 2022-01-06 NOTE — Telephone Encounter (Signed)
Attempted to reach patient again, goes straight to VM & VM BOX FULL. Will attempt again later. ?

## 2022-01-06 NOTE — Telephone Encounter (Signed)
Attempted to reach out to patient again, still goes straight to voicemail & VM box full. AM ?

## 2022-01-06 NOTE — Telephone Encounter (Signed)
Called x2, phone goes to voicemail and can not leave vm, will attempt again later. ?

## 2022-01-07 DIAGNOSIS — J45998 Other asthma: Secondary | ICD-10-CM | POA: Diagnosis not present

## 2022-01-07 DIAGNOSIS — F1020 Alcohol dependence, uncomplicated: Secondary | ICD-10-CM | POA: Diagnosis not present

## 2022-01-07 DIAGNOSIS — F319 Bipolar disorder, unspecified: Secondary | ICD-10-CM | POA: Diagnosis not present

## 2022-01-08 DIAGNOSIS — F319 Bipolar disorder, unspecified: Secondary | ICD-10-CM | POA: Diagnosis not present

## 2022-01-08 DIAGNOSIS — F1020 Alcohol dependence, uncomplicated: Secondary | ICD-10-CM | POA: Diagnosis not present

## 2022-01-08 DIAGNOSIS — J45998 Other asthma: Secondary | ICD-10-CM | POA: Diagnosis not present

## 2022-01-09 DIAGNOSIS — J45998 Other asthma: Secondary | ICD-10-CM | POA: Diagnosis not present

## 2022-01-09 DIAGNOSIS — F319 Bipolar disorder, unspecified: Secondary | ICD-10-CM | POA: Diagnosis not present

## 2022-01-09 DIAGNOSIS — F1020 Alcohol dependence, uncomplicated: Secondary | ICD-10-CM | POA: Diagnosis not present

## 2022-01-10 DIAGNOSIS — J45998 Other asthma: Secondary | ICD-10-CM | POA: Diagnosis not present

## 2022-01-10 DIAGNOSIS — F1020 Alcohol dependence, uncomplicated: Secondary | ICD-10-CM | POA: Diagnosis not present

## 2022-01-10 DIAGNOSIS — F319 Bipolar disorder, unspecified: Secondary | ICD-10-CM | POA: Diagnosis not present

## 2022-01-19 ENCOUNTER — Other Ambulatory Visit: Payer: Self-pay | Admitting: Family Medicine

## 2022-02-11 ENCOUNTER — Ambulatory Visit: Payer: BC Managed Care – PPO | Admitting: Family Medicine

## 2024-09-19 ENCOUNTER — Telehealth: Payer: Self-pay | Admitting: Family Medicine

## 2024-09-19 VITALS — Temp 98.7°F | Ht 73.0 in | Wt 175.0 lb

## 2024-09-19 DIAGNOSIS — F39 Unspecified mood [affective] disorder: Secondary | ICD-10-CM

## 2024-09-19 DIAGNOSIS — F321 Major depressive disorder, single episode, moderate: Secondary | ICD-10-CM

## 2024-09-19 DIAGNOSIS — F411 Generalized anxiety disorder: Secondary | ICD-10-CM

## 2024-09-19 MED ORDER — ESCITALOPRAM OXALATE 10 MG PO TABS: ORAL_TABLET | ORAL | 1 refills | Status: AC

## 2024-09-19 MED ORDER — LAMOTRIGINE 100 MG PO TABS: ORAL_TABLET | ORAL | 1 refills | Status: AC

## 2024-09-19 NOTE — Progress Notes (Signed)
 Albert Hobbs - 28 y.o. male MRN [id number]  Date of birth: 04/16/1996   All issues noted in this document were discussed and addressed.  No physical exam was performed (except for noted visual exam findings with Video Visits).  I discussed the limitations of evaluation and management by telemedicine and the availability of in person appointments. The patient expressed understanding and agreed to proceed.  I connected withNAME@ on 09/19/24 at  3:30 PM EST by a video enabled telemedicine application and verified that I am speaking with the correct person using two identifiers.  Present at visit: Velma Ku, DO Albert Hobbs   Patient Location: Home [address] [place] [address]   Provider location:   [facility]  Chief Complaint  Patient presents with   Anxiety    HPI  Albert Hobbs is a 28 y.o. male who presents via audio/video conferencing for a telehealth visit today.  Reports increased anxiety.  Last seen over 3 years ago and was lost to follow-up.  History of bipolar disorder with depression.  Also prior history of alcohol  abuse.  Reports that he has remain sober for the past 3 years.  Recently became a father and having more anxiety in regards to his child.  He previously treated with Lexapro  and Lamictal  felt like he did well with these.  He would like to restart these.  Denies significant depressive symptoms or suicidal ideation.     ROS:  A comprehensive ROS was completed and negative except as noted per HPI  Past Medical History:  Diagnosis Date   Alcohol  abuse 04/15/2018   GAD (generalized anxiety disorder) 09/13/2017   MDD (major depressive disorder) 09/13/2017    History reviewed. No pertinent surgical history.  Family History  Problem Relation Age of Onset   Depression Brother     Social History   Socioeconomic History   Marital status: Single    Spouse name: Not on file   Number of children: Not on file   Years of education: Not on file   Highest  education level: GED or equivalent  Occupational History   Not on file  Tobacco Use   Smoking status: Every Day   Smokeless tobacco: Never  Substance and Sexual Activity   Alcohol  use: No   Drug use: No   Sexual activity: Not on file  Other Topics Concern   Not on file  Social History Narrative   Not on file   Social Drivers of Health   Financial Resource Strain: Low Risk  (09/19/2024)   Overall Financial Resource Strain (CARDIA)    Difficulty of Paying Living Expenses: Not very hard  Food Insecurity: No Food Insecurity (09/19/2024)   Hunger Vital Sign    Worried About Running Out of Food in the Last Year: Never true    Ran Out of Food in the Last Year: Never true  Transportation Needs: No Transportation Needs (09/19/2024)   PRAPARE - Administrator, Civil Service (Medical): No    Lack of Transportation (Non-Medical): No  Physical Activity: Sufficiently Active (09/19/2024)   Exercise Vital Sign    Days of Exercise per Week: 7 days    Minutes of Exercise per Session: 150+ min  Stress: Stress Concern Present (09/19/2024)   [employer] of Occupational Health - Occupational Stress Questionnaire    Feeling of Stress: Very much  Social Connections: Moderately Isolated (09/19/2024)   Social Connection and Isolation Panel    Frequency of Communication with Friends and  Family: More than three times a week    Frequency of Social Gatherings with Friends and Family: Twice a week    Attends Religious Services: Never    Database Administrator or Organizations: No    Attends Engineer, Structural: Not on file    Marital Status: Married  Catering Manager Violence: Not on file     Current Outpatient Medications:    albuterol  (VENTOLIN  HFA) 108 (90 Base) MCG/ACT inhaler, Inhale 2 puffs into the lungs every 4 (four) hours as needed for wheezing or shortness of breath. appt for refills, Disp: 8 g, Rfl: 0   escitalopram  (LEXAPRO ) 10 MG tablet, Take 1/2 tab x7 days then  increase to a full tab., Disp: 90 tablet, Rfl: 1   lamoTRIgine  (LAMICTAL ) 100 MG tablet, Take 50mg  daily x2 weeks then increase to 50mg  BID x2 weeks then 100mg  BID, Disp: 180 tablet, Rfl: 1  EXAM:  VITALS per patient if applicable: Temp 98.7 F (37.1 C)   Ht 6' 1 (1.854 m)   Wt 175 lb (79.4 kg)   BMI 23.09 kg/m   GENERAL: alert, oriented, appears well and in no acute distress  HEENT: atraumatic, conjunttiva clear, no obvious abnormalities on inspection of external nose and ears  NECK: normal movements of the head and neck  LUNGS: on inspection no signs of respiratory distress, breathing rate appears normal, no obvious gross SOB, gasping or wheezing  CV: no obvious cyanosis  MS: moves all visible extremities without noticeable abnormality  PSYCH/NEURO: pleasant and cooperative, no obvious depression or anxiety, speech and thought processing grossly intact  ASSESSMENT AND PLAN:  Discussed the following assessment and plan:  GAD (generalized anxiety disorder) Restarting Lexapro  and Lamictal .  Titrate Lamictal  over the next few weeks.  Follow-up in 4 to 6 weeks.     I discussed the assessment and treatment plan with the patient. The patient was provided an opportunity to ask questions and all were answered. The patient agreed with the plan and demonstrated an understanding of the instructions.   The patient was advised to call back or seek an in-person evaluation if the symptoms worsen or if the condition fails to improve as anticipated.    Velma Ku, DO

## 2024-09-19 NOTE — Assessment & Plan Note (Signed)
 Restarting Lexapro  and Lamictal .  Titrate Lamictal  over the next few weeks.  Follow-up in 4 to 6 weeks.
